# Patient Record
Sex: Male | Born: 1935
Health system: Southern US, Community
[De-identification: ages and names within clinical notes are randomized; demographics above are authoritative.]

## PROBLEM LIST (undated history)

## (undated) DIAGNOSIS — M810 Age-related osteoporosis without current pathological fracture: Secondary | ICD-10-CM

## (undated) DIAGNOSIS — E785 Hyperlipidemia, unspecified: Secondary | ICD-10-CM

## (undated) DIAGNOSIS — H409 Unspecified glaucoma: Secondary | ICD-10-CM

## (undated) DIAGNOSIS — E222 Syndrome of inappropriate secretion of antidiuretic hormone: Secondary | ICD-10-CM

## (undated) DIAGNOSIS — F0390 Unspecified dementia without behavioral disturbance: Secondary | ICD-10-CM

## (undated) DIAGNOSIS — I10 Essential (primary) hypertension: Secondary | ICD-10-CM

## (undated) HISTORY — PX: TRANSURETHRAL RESECTION OF PROSTATE: SHX73

## (undated) HISTORY — PX: CATARACT EXTRACTION, BILATERAL: SHX1313

---

## 2002-04-09 ENCOUNTER — Ambulatory Visit (HOSPITAL_COMMUNITY): Admission: RE | Admit: 2002-04-09 | Discharge: 2002-04-09 | Payer: Self-pay | Admitting: Gastroenterology

## 2004-03-05 ENCOUNTER — Inpatient Hospital Stay (HOSPITAL_COMMUNITY): Admission: AD | Admit: 2004-03-05 | Discharge: 2004-03-10 | Payer: Self-pay | Admitting: *Deleted

## 2011-01-26 ENCOUNTER — Other Ambulatory Visit: Payer: Self-pay | Admitting: Family Medicine

## 2011-01-26 DIAGNOSIS — R404 Transient alteration of awareness: Secondary | ICD-10-CM

## 2011-01-29 ENCOUNTER — Other Ambulatory Visit: Payer: Self-pay | Admitting: Family Medicine

## 2011-01-29 ENCOUNTER — Ambulatory Visit
Admission: RE | Admit: 2011-01-29 | Discharge: 2011-01-29 | Disposition: A | Discharge status: Home / Self Care | Payer: Medicare Other | Source: Ambulatory Visit | Attending: Family Medicine | Admitting: Family Medicine

## 2011-01-29 DIAGNOSIS — R404 Transient alteration of awareness: Secondary | ICD-10-CM

## 2011-01-29 MED ORDER — IOHEXOL 300 MG/ML  SOLN
75.0000 mL | Freq: Once | INTRAMUSCULAR | Status: AC | PRN
  Administered 2011-01-29: 75 mL via INTRAVENOUS

## 2013-01-30 ENCOUNTER — Other Ambulatory Visit: Payer: Self-pay | Admitting: Ophthalmology

## 2013-08-26 ENCOUNTER — Emergency Department (HOSPITAL_COMMUNITY): Payer: Medicare Other

## 2013-08-26 ENCOUNTER — Inpatient Hospital Stay (HOSPITAL_COMMUNITY): Payer: Medicare Other | Admitting: Anesthesiology

## 2013-08-26 ENCOUNTER — Encounter (HOSPITAL_COMMUNITY): Payer: Medicare Other | Admitting: Anesthesiology

## 2013-08-26 ENCOUNTER — Inpatient Hospital Stay (HOSPITAL_COMMUNITY)
Admission: EM | Admit: 2013-08-26 | Discharge: 2013-08-31 | DRG: 469 | Disposition: A | Discharge status: Skilled Nursing Facility | Payer: Medicare Other | Attending: Internal Medicine | Admitting: Internal Medicine

## 2013-08-26 ENCOUNTER — Encounter (HOSPITAL_COMMUNITY)
Admission: EM | Disposition: A | Discharge status: Skilled Nursing Facility | Payer: Self-pay | Source: Home / Self Care | Attending: Internal Medicine

## 2013-08-26 ENCOUNTER — Inpatient Hospital Stay (HOSPITAL_COMMUNITY): Payer: Medicare Other

## 2013-08-26 ENCOUNTER — Encounter (HOSPITAL_COMMUNITY): Payer: Self-pay | Admitting: Emergency Medicine

## 2013-08-26 DIAGNOSIS — Z9849 Cataract extraction status, unspecified eye: Secondary | ICD-10-CM

## 2013-08-26 DIAGNOSIS — Y92009 Unspecified place in unspecified non-institutional (private) residence as the place of occurrence of the external cause: Secondary | ICD-10-CM

## 2013-08-26 DIAGNOSIS — R1310 Dysphagia, unspecified: Secondary | ICD-10-CM

## 2013-08-26 DIAGNOSIS — M4040 Postural lordosis, site unspecified: Secondary | ICD-10-CM | POA: Diagnosis present

## 2013-08-26 DIAGNOSIS — E871 Hypo-osmolality and hyponatremia: Secondary | ICD-10-CM

## 2013-08-26 DIAGNOSIS — J690 Pneumonitis due to inhalation of food and vomit: Secondary | ICD-10-CM

## 2013-08-26 DIAGNOSIS — F0390 Unspecified dementia without behavioral disturbance: Secondary | ICD-10-CM | POA: Diagnosis present

## 2013-08-26 DIAGNOSIS — Z96649 Presence of unspecified artificial hip joint: Secondary | ICD-10-CM

## 2013-08-26 DIAGNOSIS — I10 Essential (primary) hypertension: Secondary | ICD-10-CM | POA: Diagnosis present

## 2013-08-26 DIAGNOSIS — H409 Unspecified glaucoma: Secondary | ICD-10-CM | POA: Diagnosis present

## 2013-08-26 DIAGNOSIS — IMO0002 Reserved for concepts with insufficient information to code with codable children: Secondary | ICD-10-CM

## 2013-08-26 DIAGNOSIS — S72012A Unspecified intracapsular fracture of left femur, initial encounter for closed fracture: Secondary | ICD-10-CM | POA: Diagnosis present

## 2013-08-26 DIAGNOSIS — E440 Moderate protein-calorie malnutrition: Secondary | ICD-10-CM

## 2013-08-26 DIAGNOSIS — D696 Thrombocytopenia, unspecified: Secondary | ICD-10-CM | POA: Diagnosis present

## 2013-08-26 DIAGNOSIS — S72002A Fracture of unspecified part of neck of left femur, initial encounter for closed fracture: Secondary | ICD-10-CM

## 2013-08-26 DIAGNOSIS — M810 Age-related osteoporosis without current pathological fracture: Secondary | ICD-10-CM | POA: Diagnosis present

## 2013-08-26 DIAGNOSIS — R9089 Other abnormal findings on diagnostic imaging of central nervous system: Secondary | ICD-10-CM

## 2013-08-26 DIAGNOSIS — Z7982 Long term (current) use of aspirin: Secondary | ICD-10-CM

## 2013-08-26 DIAGNOSIS — R930 Abnormal findings on diagnostic imaging of skull and head, not elsewhere classified: Secondary | ICD-10-CM

## 2013-08-26 DIAGNOSIS — R5082 Postprocedural fever: Secondary | ICD-10-CM | POA: Diagnosis not present

## 2013-08-26 DIAGNOSIS — E236 Other disorders of pituitary gland: Secondary | ICD-10-CM | POA: Diagnosis present

## 2013-08-26 DIAGNOSIS — R339 Retention of urine, unspecified: Secondary | ICD-10-CM | POA: Diagnosis not present

## 2013-08-26 DIAGNOSIS — S72033A Displaced midcervical fracture of unspecified femur, initial encounter for closed fracture: Principal | ICD-10-CM | POA: Diagnosis present

## 2013-08-26 DIAGNOSIS — W010XXA Fall on same level from slipping, tripping and stumbling without subsequent striking against object, initial encounter: Secondary | ICD-10-CM | POA: Diagnosis present

## 2013-08-26 DIAGNOSIS — E785 Hyperlipidemia, unspecified: Secondary | ICD-10-CM | POA: Diagnosis present

## 2013-08-26 DIAGNOSIS — E43 Unspecified severe protein-calorie malnutrition: Secondary | ICD-10-CM | POA: Diagnosis present

## 2013-08-26 DIAGNOSIS — R9389 Abnormal findings on diagnostic imaging of other specified body structures: Secondary | ICD-10-CM | POA: Diagnosis present

## 2013-08-26 DIAGNOSIS — S72019A Unspecified intracapsular fracture of unspecified femur, initial encounter for closed fracture: Secondary | ICD-10-CM

## 2013-08-26 DIAGNOSIS — Z8673 Personal history of transient ischemic attack (TIA), and cerebral infarction without residual deficits: Secondary | ICD-10-CM

## 2013-08-26 DIAGNOSIS — D62 Acute posthemorrhagic anemia: Secondary | ICD-10-CM | POA: Diagnosis not present

## 2013-08-26 DIAGNOSIS — R319 Hematuria, unspecified: Secondary | ICD-10-CM

## 2013-08-26 HISTORY — DX: Syndrome of inappropriate secretion of antidiuretic hormone: E22.2

## 2013-08-26 HISTORY — DX: Essential (primary) hypertension: I10

## 2013-08-26 HISTORY — PX: HIP ARTHROPLASTY: SHX981

## 2013-08-26 HISTORY — DX: Hyperlipidemia, unspecified: E78.5

## 2013-08-26 HISTORY — DX: Unspecified glaucoma: H40.9

## 2013-08-26 HISTORY — DX: Age-related osteoporosis without current pathological fracture: M81.0

## 2013-08-26 LAB — CBC WITH DIFFERENTIAL/PLATELET
BASOS ABS: 0 10*3/uL (ref 0.0–0.1)
BASOS PCT: 0 % (ref 0–1)
Eosinophils Absolute: 0 10*3/uL (ref 0.0–0.7)
Eosinophils Relative: 0 % (ref 0–5)
HEMATOCRIT: 40.1 % (ref 39.0–52.0)
Hemoglobin: 13.4 g/dL (ref 13.0–17.0)
LYMPHS PCT: 6 % — AB (ref 12–46)
Lymphs Abs: 0.4 10*3/uL — ABNORMAL LOW (ref 0.7–4.0)
MCH: 31.5 pg (ref 26.0–34.0)
MCHC: 33.4 g/dL (ref 30.0–36.0)
MCV: 94.1 fL (ref 78.0–100.0)
Monocytes Absolute: 0.6 10*3/uL (ref 0.1–1.0)
Monocytes Relative: 9 % (ref 3–12)
NEUTROS PCT: 85 % — AB (ref 43–77)
Neutro Abs: 5.9 10*3/uL (ref 1.7–7.7)
Platelets: 149 10*3/uL — ABNORMAL LOW (ref 150–400)
RBC: 4.26 MIL/uL (ref 4.22–5.81)
RDW: 13.3 % (ref 11.5–15.5)
WBC: 6.9 10*3/uL (ref 4.0–10.5)

## 2013-08-26 LAB — SURGICAL PCR SCREEN
MRSA, PCR: INVALID — AB
Staphylococcus aureus: INVALID — AB

## 2013-08-26 LAB — PROTIME-INR
INR: 1.01 (ref 0.00–1.49)
Prothrombin Time: 13.1 seconds (ref 11.6–15.2)

## 2013-08-26 LAB — BASIC METABOLIC PANEL
BUN: 21 mg/dL (ref 6–23)
CO2: 22 meq/L (ref 19–32)
CREATININE: 0.71 mg/dL (ref 0.50–1.35)
Calcium: 8.7 mg/dL (ref 8.4–10.5)
Chloride: 99 mEq/L (ref 96–112)
GFR, EST NON AFRICAN AMERICAN: 84 mL/min — AB (ref >90)
GLUCOSE: 127 mg/dL — AB (ref 70–99)
POTASSIUM: 4 meq/L (ref 3.7–5.3)
SODIUM: 136 meq/L — AB (ref 137–147)

## 2013-08-26 LAB — ABO/RH: ABO/RH(D): O POS

## 2013-08-26 LAB — TYPE AND SCREEN
ABO/RH(D): O POS
Antibody Screen: NEGATIVE

## 2013-08-26 SURGERY — HEMIARTHROPLASTY, HIP, DIRECT ANTERIOR APPROACH, FOR FRACTURE
Anesthesia: General

## 2013-08-26 SURGERY — HEMIARTHROPLASTY, HIP, DIRECT ANTERIOR APPROACH, FOR FRACTURE
Anesthesia: General | Site: Hip | Laterality: Left

## 2013-08-26 MED ORDER — CEFAZOLIN SODIUM-DEXTROSE 2-3 GM-% IV SOLR
2.0000 g | Freq: Four times a day (QID) | INTRAVENOUS | Status: AC
  Administered 2013-08-27 (×2): 2 g via INTRAVENOUS
  Filled 2013-08-26 (×2): qty 50

## 2013-08-26 MED ORDER — BUPIVACAINE-EPINEPHRINE PF 0.5-1:200000 % IJ SOLN
INTRAMUSCULAR | Status: DC | PRN
  Administered 2013-08-26: 28 mL

## 2013-08-26 MED ORDER — SUCCINYLCHOLINE CHLORIDE 20 MG/ML IJ SOLN
INTRAMUSCULAR | Status: AC
  Filled 2013-08-26: qty 1

## 2013-08-26 MED ORDER — ACETAMINOPHEN 10 MG/ML IV SOLN
INTRAVENOUS | Status: DC | PRN
  Administered 2013-08-26: 1000 mg via INTRAVENOUS

## 2013-08-26 MED ORDER — FERROUS SULFATE 325 (65 FE) MG PO TABS
325.0000 mg | ORAL_TABLET | Freq: Every day | ORAL | Status: DC
  Administered 2013-08-27 – 2013-08-31 (×5): 325 mg via ORAL
  Filled 2013-08-26 (×6): qty 1

## 2013-08-26 MED ORDER — NEOSTIGMINE METHYLSULFATE 1 MG/ML IJ SOLN
INTRAMUSCULAR | Status: DC | PRN
  Administered 2013-08-26: 3 mg via INTRAVENOUS

## 2013-08-26 MED ORDER — PROPOFOL 10 MG/ML IV BOLUS
INTRAVENOUS | Status: DC | PRN
  Administered 2013-08-26: 150 mg via INTRAVENOUS

## 2013-08-26 MED ORDER — SODIUM CHLORIDE 0.9 % IV SOLN
INTRAVENOUS | Status: DC
  Administered 2013-08-26 – 2013-08-29 (×4): via INTRAVENOUS

## 2013-08-26 MED ORDER — SODIUM CHLORIDE 0.9 % IV SOLN: INTRAVENOUS | Status: DC

## 2013-08-26 MED ORDER — ROCURONIUM BROMIDE 100 MG/10ML IV SOLN
INTRAVENOUS | Status: DC | PRN
  Administered 2013-08-26: 40 mg via INTRAVENOUS

## 2013-08-26 MED ORDER — LIDOCAINE HCL (CARDIAC) 20 MG/ML IV SOLN
INTRAVENOUS | Status: DC | PRN
  Administered 2013-08-26: 75 mg via INTRAVENOUS
  Administered 2013-08-26: 25 mg via INTRAVENOUS

## 2013-08-26 MED ORDER — ONDANSETRON HCL 4 MG/2ML IJ SOLN
INTRAMUSCULAR | Status: DC | PRN
  Administered 2013-08-26: 4 mg via INTRAVENOUS

## 2013-08-26 MED ORDER — NEOSTIGMINE METHYLSULFATE 1 MG/ML IJ SOLN
INTRAMUSCULAR | Status: AC
  Filled 2013-08-26: qty 10

## 2013-08-26 MED ORDER — MORPHINE SULFATE 2 MG/ML IJ SOLN
0.5000 mg | INTRAMUSCULAR | Status: DC | PRN
  Administered 2013-08-26 (×2): 0.5 mg via INTRAVENOUS
  Filled 2013-08-26 (×2): qty 1

## 2013-08-26 MED ORDER — BUPIVACAINE-EPINEPHRINE 0.5% -1:200000 IJ SOLN
INTRAMUSCULAR | Status: AC
  Filled 2013-08-26: qty 1

## 2013-08-26 MED ORDER — LIDOCAINE HCL (CARDIAC) 20 MG/ML IV SOLN
INTRAVENOUS | Status: AC
  Filled 2013-08-26: qty 5

## 2013-08-26 MED ORDER — ACETAMINOPHEN 325 MG PO TABS
650.0000 mg | ORAL_TABLET | Freq: Four times a day (QID) | ORAL | Status: DC | PRN
  Administered 2013-08-27 – 2013-08-31 (×4): 650 mg via ORAL
  Filled 2013-08-26 (×4): qty 2

## 2013-08-26 MED ORDER — HYDROCODONE-ACETAMINOPHEN 5-325 MG PO TABS
1.0000 | ORAL_TABLET | Freq: Four times a day (QID) | ORAL | Status: DC | PRN
  Administered 2013-08-27 – 2013-08-31 (×7): 1 via ORAL
  Filled 2013-08-26 (×7): qty 1

## 2013-08-26 MED ORDER — TRANEXAMIC ACID 100 MG/ML IV SOLN
1000.0000 mg | INTRAVENOUS | Status: AC
  Administered 2013-08-26: 1000 mg via INTRAVENOUS
  Filled 2013-08-26: qty 10

## 2013-08-26 MED ORDER — HYDROCODONE-ACETAMINOPHEN 5-325 MG PO TABS: 1.0000 | ORAL_TABLET | Freq: Four times a day (QID) | ORAL | Status: DC | PRN

## 2013-08-26 MED ORDER — PHENYLEPHRINE HCL 10 MG/ML IJ SOLN
INTRAMUSCULAR | Status: AC
  Filled 2013-08-26: qty 1

## 2013-08-26 MED ORDER — ASPIRIN EC 325 MG PO TBEC: 325.0000 mg | DELAYED_RELEASE_TABLET | Freq: Two times a day (BID) | ORAL | Status: AC

## 2013-08-26 MED ORDER — SODIUM CHLORIDE 0.9 % IV SOLN
1000.0000 mL | INTRAVENOUS | Status: DC
  Administered 2013-08-26: 1000 mL via INTRAVENOUS

## 2013-08-26 MED ORDER — FENTANYL CITRATE 0.05 MG/ML IJ SOLN: 25.0000 ug | INTRAMUSCULAR | Status: DC | PRN

## 2013-08-26 MED ORDER — BUPIVACAINE-EPINEPHRINE PF 0.5-1:200000 % IJ SOLN
INTRAMUSCULAR | Status: AC
  Filled 2013-08-26: qty 30

## 2013-08-26 MED ORDER — ACETAMINOPHEN 10 MG/ML IV SOLN: 1000.0000 mg | Freq: Once | INTRAVENOUS | Status: DC

## 2013-08-26 MED ORDER — FENTANYL CITRATE 0.05 MG/ML IJ SOLN
INTRAMUSCULAR | Status: DC | PRN
  Administered 2013-08-26: 100 ug via INTRAVENOUS
  Administered 2013-08-26: 50 ug via INTRAVENOUS
  Administered 2013-08-26: 100 ug via INTRAVENOUS

## 2013-08-26 MED ORDER — TIMOLOL HEMIHYDRATE 0.5 % OP SOLN: 1.0000 [drp] | Freq: Every day | OPHTHALMIC | Status: DC

## 2013-08-26 MED ORDER — CEFAZOLIN SODIUM-DEXTROSE 2-3 GM-% IV SOLR
INTRAVENOUS | Status: AC
  Filled 2013-08-26: qty 50

## 2013-08-26 MED ORDER — RAMIPRIL 10 MG PO CAPS
10.0000 mg | ORAL_CAPSULE | Freq: Every day | ORAL | Status: DC
  Administered 2013-08-27 – 2013-08-31 (×5): 10 mg via ORAL
  Filled 2013-08-26 (×5): qty 1

## 2013-08-26 MED ORDER — PILOCARPINE HCL 4 % OP SOLN
1.0000 [drp] | Freq: Four times a day (QID) | OPHTHALMIC | Status: DC
Start: 2013-08-26 — End: 2013-08-31
  Administered 2013-08-27 – 2013-08-31 (×18): 1 [drp] via OPHTHALMIC
  Filled 2013-08-26: qty 15

## 2013-08-26 MED ORDER — DOCUSATE SODIUM 100 MG PO CAPS
100.0000 mg | ORAL_CAPSULE | Freq: Two times a day (BID) | ORAL | Status: DC
  Administered 2013-08-27 – 2013-08-31 (×9): 100 mg via ORAL

## 2013-08-26 MED ORDER — PHENYLEPHRINE HCL 10 MG/ML IJ SOLN
INTRAMUSCULAR | Status: DC | PRN
Start: 2013-08-26 — End: 2013-08-26
  Administered 2013-08-26: 80 ug via INTRAVENOUS
  Administered 2013-08-26: 120 ug via INTRAVENOUS
  Administered 2013-08-26: 160 ug via INTRAVENOUS
  Administered 2013-08-26: 80 ug via INTRAVENOUS

## 2013-08-26 MED ORDER — ONDANSETRON HCL 4 MG PO TABS: 4.0000 mg | ORAL_TABLET | Freq: Four times a day (QID) | ORAL | Status: DC | PRN

## 2013-08-26 MED ORDER — FENTANYL CITRATE 0.05 MG/ML IJ SOLN
50.0000 ug | Freq: Once | INTRAMUSCULAR | Status: AC
  Administered 2013-08-26: 50 ug via INTRAVENOUS
  Filled 2013-08-26: qty 2

## 2013-08-26 MED ORDER — ASPIRIN EC 325 MG PO TBEC
325.0000 mg | DELAYED_RELEASE_TABLET | Freq: Two times a day (BID) | ORAL | Status: DC
  Administered 2013-08-27 – 2013-08-31 (×9): 325 mg via ORAL
  Filled 2013-08-26 (×11): qty 1

## 2013-08-26 MED ORDER — GLYCOPYRROLATE 0.2 MG/ML IJ SOLN
INTRAMUSCULAR | Status: DC | PRN
  Administered 2013-08-26: 0.4 mg via INTRAVENOUS

## 2013-08-26 MED ORDER — ALUM & MAG HYDROXIDE-SIMETH 200-200-20 MG/5ML PO SUSP: 30.0000 mL | ORAL | Status: DC | PRN

## 2013-08-26 MED ORDER — SUCCINYLCHOLINE CHLORIDE 20 MG/ML IJ SOLN
INTRAMUSCULAR | Status: DC | PRN
  Administered 2013-08-26: 100 mg via INTRAVENOUS

## 2013-08-26 MED ORDER — ONDANSETRON HCL 4 MG/2ML IJ SOLN: 4.0000 mg | Freq: Four times a day (QID) | INTRAMUSCULAR | Status: DC | PRN

## 2013-08-26 MED ORDER — ROCURONIUM BROMIDE 100 MG/10ML IV SOLN
INTRAVENOUS | Status: AC
  Filled 2013-08-26: qty 1

## 2013-08-26 MED ORDER — TIMOLOL MALEATE 0.5 % OP SOLN
1.0000 [drp] | Freq: Every day | OPHTHALMIC | Status: DC
  Administered 2013-08-27 – 2013-08-31 (×5): 1 [drp] via OPHTHALMIC
  Filled 2013-08-26: qty 5

## 2013-08-26 MED ORDER — FENTANYL CITRATE 0.05 MG/ML IJ SOLN
INTRAMUSCULAR | Status: AC
  Filled 2013-08-26: qty 5

## 2013-08-26 MED ORDER — ASPIRIN 81 MG PO CHEW: 81.0000 mg | CHEWABLE_TABLET | Freq: Every day | ORAL | Status: DC

## 2013-08-26 MED ORDER — ACETAMINOPHEN 650 MG RE SUPP: 650.0000 mg | Freq: Four times a day (QID) | RECTAL | Status: DC | PRN

## 2013-08-26 MED ORDER — ONDANSETRON HCL 4 MG/2ML IJ SOLN
INTRAMUSCULAR | Status: AC
  Filled 2013-08-26: qty 2

## 2013-08-26 MED ORDER — DEXTROSE 5 % IV SOLN
10.0000 mg | INTRAVENOUS | Status: DC | PRN
  Administered 2013-08-26: 10 ug/min via INTRAVENOUS

## 2013-08-26 MED ORDER — CHLORHEXIDINE GLUCONATE 4 % EX LIQD
60.0000 mL | Freq: Once | CUTANEOUS | Status: DC
  Filled 2013-08-26: qty 60

## 2013-08-26 MED ORDER — HEPARIN SODIUM (PORCINE) 5000 UNIT/ML IJ SOLN
5000.0000 [IU] | Freq: Three times a day (TID) | INTRAMUSCULAR | Status: DC
  Filled 2013-08-26 (×2): qty 1

## 2013-08-26 MED ORDER — FENTANYL CITRATE 0.05 MG/ML IJ SOLN
100.0000 ug | Freq: Once | INTRAMUSCULAR | Status: AC
  Administered 2013-08-26: 100 ug via INTRAVENOUS
  Filled 2013-08-26: qty 2

## 2013-08-26 MED ORDER — HYDROCODONE-ACETAMINOPHEN 5-325 MG PO TABS: 1.0000 | ORAL_TABLET | Freq: Three times a day (TID) | ORAL | Status: AC | PRN

## 2013-08-26 MED ORDER — METHOCARBAMOL 100 MG/ML IJ SOLN
500.0000 mg | Freq: Four times a day (QID) | INTRAVENOUS | Status: DC | PRN
  Filled 2013-08-26: qty 5

## 2013-08-26 MED ORDER — CEFAZOLIN SODIUM-DEXTROSE 2-3 GM-% IV SOLR
INTRAVENOUS | Status: DC | PRN
  Administered 2013-08-26: 2 g via INTRAVENOUS

## 2013-08-26 MED ORDER — ACETAMINOPHEN 10 MG/ML IV SOLN
1000.0000 mg | Freq: Once | INTRAVENOUS | Status: DC
  Filled 2013-08-26: qty 100

## 2013-08-26 MED ORDER — PROPOFOL 10 MG/ML IV BOLUS
INTRAVENOUS | Status: AC
  Filled 2013-08-26: qty 20

## 2013-08-26 MED ORDER — LACTATED RINGERS IV SOLN
INTRAVENOUS | Status: DC | PRN
  Administered 2013-08-26 (×2): via INTRAVENOUS

## 2013-08-26 MED ORDER — METHOCARBAMOL 500 MG PO TABS
500.0000 mg | ORAL_TABLET | Freq: Four times a day (QID) | ORAL | Status: DC | PRN
  Administered 2013-08-29: 500 mg via ORAL
  Filled 2013-08-26: qty 1

## 2013-08-26 MED ORDER — PHENYLEPHRINE 40 MCG/ML (10ML) SYRINGE FOR IV PUSH (FOR BLOOD PRESSURE SUPPORT)
PREFILLED_SYRINGE | INTRAVENOUS | Status: AC
  Filled 2013-08-26: qty 10

## 2013-08-26 MED ORDER — GLYCOPYRROLATE 0.2 MG/ML IJ SOLN
INTRAMUSCULAR | Status: AC
  Filled 2013-08-26: qty 3

## 2013-08-26 MED ORDER — SENNOSIDES-DOCUSATE SODIUM 8.6-50 MG PO TABS: 1.0000 | ORAL_TABLET | Freq: Every evening | ORAL | Status: DC | PRN

## 2013-08-26 MED ORDER — TRANEXAMIC ACID 100 MG/ML IV SOLN
1000.0000 mg | INTRAVENOUS | Status: DC
  Filled 2013-08-26: qty 10

## 2013-08-26 MED ORDER — DEXAMETHASONE SODIUM PHOSPHATE 10 MG/ML IJ SOLN
INTRAMUSCULAR | Status: DC | PRN
Start: 2013-08-26 — End: 2013-08-26
  Administered 2013-08-26: 10 mg via INTRAVENOUS

## 2013-08-26 SURGICAL SUPPLY — 46 items
BAG SPEC THK2 15X12 ZIP CLS (MISCELLANEOUS)
BAG ZIPLOCK 12X15 (MISCELLANEOUS) ×1 IMPLANT
BIT DRILL 2.4X128 (BIT) ×3 IMPLANT
BLADE SAW SAG 73X25 THK (BLADE) ×1
BLADE SAW SGTL 73X25 THK (BLADE) ×1 IMPLANT
BRUSH FEMORAL CANAL (MISCELLANEOUS) IMPLANT
CAPT HIP FX BIPOLAR/UNIPOLAR ×1 IMPLANT
CEMENT BONE DEPUY (Cement) ×2 IMPLANT
CEMENT RESTRICTOR DEPUY SZ 3 (Cement) ×1 IMPLANT
COVER SURGICAL LIGHT HANDLE (MISCELLANEOUS) ×2 IMPLANT
DRAPE INCISE IOBAN 66X45 STRL (DRAPES) ×2 IMPLANT
DRAPE ORTHO SPLIT 77X108 STRL (DRAPES) ×4
DRAPE POUCH INSTRU U-SHP 10X18 (DRAPES) ×2 IMPLANT
DRAPE SURG ORHT 6 SPLT 77X108 (DRAPES) ×2 IMPLANT
DRSG MEPILEX BORDER 4X8 (GAUZE/BANDAGES/DRESSINGS) ×2 IMPLANT
DURAPREP 26ML APPLICATOR (WOUND CARE) ×2 IMPLANT
ELECT REM PT RETURN 9FT ADLT (ELECTROSURGICAL) ×2
ELECTRODE REM PT RTRN 9FT ADLT (ELECTROSURGICAL) ×1 IMPLANT
EVACUATOR 1/8 PVC DRAIN (DRAIN) ×1 IMPLANT
FACESHIELD LNG OPTICON STERILE (SAFETY) ×6 IMPLANT
GLOVE BIO SURGEON STRL SZ8 (GLOVE) ×4 IMPLANT
GLOVE ECLIPSE 7.5 STRL STRAW (GLOVE) ×8 IMPLANT
HANDPIECE INTERPULSE COAX TIP (DISPOSABLE)
HOOD PEEL AWAY FACE SHEILD DIS (HOOD) ×4 IMPLANT
IMMOBILIZER KNEE 20 (SOFTGOODS) ×1 IMPLANT
MANIFOLD NEPTUNE II (INSTRUMENTS) ×2 IMPLANT
NEEDLE HYPO 22GX1.5 SAFETY (NEEDLE) ×2 IMPLANT
NS IRRIG 1000ML POUR BTL (IV SOLUTION) ×2 IMPLANT
PACK TOTAL JOINT (CUSTOM PROCEDURE TRAY) ×2 IMPLANT
PASSER SUT SWANSON 36MM LOOP (INSTRUMENTS) ×2 IMPLANT
POSITIONER SURGICAL ARM (MISCELLANEOUS) ×2 IMPLANT
PRESSURIZER FEMORAL UNIV (MISCELLANEOUS) ×1 IMPLANT
SET HNDPC FAN SPRY TIP SCT (DISPOSABLE) IMPLANT
STAPLER VISISTAT 35W (STAPLE) ×2 IMPLANT
SUCTION FRAZIER TIP 10 FR DISP (SUCTIONS) ×2 IMPLANT
SUT ETHIBOND NAB CT1 #1 30IN (SUTURE) ×4 IMPLANT
SUT VIC AB 0 CTX 27 (SUTURE) ×4 IMPLANT
SUT VIC AB 1 CTX 36 (SUTURE) ×6
SUT VIC AB 1 CTX36XBRD ANBCTR (SUTURE) ×4 IMPLANT
SUT VIC AB 2-0 CT1 27 (SUTURE) ×4
SUT VIC AB 2-0 CT1 TAPERPNT 27 (SUTURE) ×3 IMPLANT
SYR CONTROL 10ML LL (SYRINGE) ×2 IMPLANT
TOWEL OR 17X26 10 PK STRL BLUE (TOWEL DISPOSABLE) ×2 IMPLANT
TOWER CARTRIDGE SMART MIX (DISPOSABLE) ×1 IMPLANT
TRAY FOLEY CATH 14FRSI W/METER (CATHETERS) IMPLANT
WATER STERILE IRR 1500ML POUR (IV SOLUTION) ×1 IMPLANT

## 2013-08-26 NOTE — Consult Note (Signed)
Reason for Consult:femoral neck fracture left side. Referring Physician: hospitalist service  Todd Whitaker is an 80 y.o. male.  HPI: the patient is an 80 year old otherwise healthy male who fell onto his left side today.  He was admitted to the hospital with a diagnosis of left femoral neck fracture.  We were consulted for management of the left femoral neck fracture.  He was admitted to the internal medicine service and will be followed by them for medical issues and we will treat his left femoral neck fracture.  Past Medical History  Diagnosis Date  . Hypertension   . Glaucoma   . Osteoporosis   . Hyperlipidemia     Past Surgical History  Procedure Laterality Date  . Cataract extraction, bilateral    . Transurethral resection of prostate      History reviewed. No pertinent family history.  Social History:  reports that he has never smoked. He has never used smokeless tobacco. He reports that he does not drink alcohol or use illicit drugs.  Allergies: No Known Allergies  Medications: I have reviewed the patient's current medications.  Results for orders placed during the hospital encounter of 08/26/13 (from the past 48 hour(s))  BASIC METABOLIC PANEL     Status: Abnormal   Collection Time    08/26/13 11:00 AM      Result Value Ref Range   Sodium 136 (*) 137 - 147 mEq/L   Potassium 4.0  3.7 - 5.3 mEq/L   Chloride 99  96 - 112 mEq/L   CO2 22  19 - 32 mEq/L   Glucose, Bld 127 (*) 70 - 99 mg/dL   BUN 21  6 - 23 mg/dL   Creatinine, Ser 0.71  0.50 - 1.35 mg/dL   Calcium 8.7  8.4 - 10.5 mg/dL   GFR calc non Af Amer 84 (*) >90 mL/min   GFR calc Af Amer >90  >90 mL/min   Comment: (NOTE)     The eGFR has been calculated using the CKD EPI equation.     This calculation has not been validated in all clinical situations.     eGFR's persistently <90 mL/min signify possible Chronic Kidney     Disease.  CBC WITH DIFFERENTIAL     Status: Abnormal   Collection Time    08/26/13  11:00 AM      Result Value Ref Range   WBC 6.9  4.0 - 10.5 K/uL   RBC 4.26  4.22 - 5.81 MIL/uL   Hemoglobin 13.4  13.0 - 17.0 g/dL   HCT 40.1  39.0 - 52.0 %   MCV 94.1  78.0 - 100.0 fL   MCH 31.5  26.0 - 34.0 pg   MCHC 33.4  30.0 - 36.0 g/dL   RDW 13.3  11.5 - 15.5 %   Platelets 149 (*) 150 - 400 K/uL   Neutrophils Relative % 85 (*) 43 - 77 %   Neutro Abs 5.9  1.7 - 7.7 K/uL   Lymphocytes Relative 6 (*) 12 - 46 %   Lymphs Abs 0.4 (*) 0.7 - 4.0 K/uL   Monocytes Relative 9  3 - 12 %   Monocytes Absolute 0.6  0.1 - 1.0 K/uL   Eosinophils Relative 0  0 - 5 %   Eosinophils Absolute 0.0  0.0 - 0.7 K/uL   Basophils Relative 0  0 - 1 %   Basophils Absolute 0.0  0.0 - 0.1 K/uL  PROTIME-INR     Status: None  Collection Time    08/26/13 11:00 AM      Result Value Ref Range   Prothrombin Time 13.1  11.6 - 15.2 seconds   INR 1.01  0.00 - 1.49  TYPE AND SCREEN     Status: None   Collection Time    08/26/13 11:00 AM      Result Value Ref Range   ABO/RH(D) O POS     Antibody Screen NEG     Sample Expiration 08/29/2013    ABO/RH     Status: None   Collection Time    08/26/13 11:00 AM      Result Value Ref Range   ABO/RH(D) O POS      Dg Hip Complete Left  08/26/2013   CLINICAL DATA:  Fall.  EXAM: LEFT HIP - COMPLETE 2+ VIEW  COMPARISON:  None.  FINDINGS: Angulated subcapital femoral neck fracture is present on the left. Diffuse osteopenia and degenerative change present. Calcifications in pelvis consistent with phleboliths.  IMPRESSION: Angulated left subcapital femoral neck fracture.   Electronically Signed   By: Marcello Moores  Register   On: 08/26/2013 10:22   Ct Head Wo Contrast  08/26/2013   CLINICAL DATA:  Mental status change, status post fall.  EXAM: CT HEAD WITHOUT CONTRAST  TECHNIQUE: Contiguous axial images were obtained from the base of the skull through the vertex without intravenous contrast.  COMPARISON:  CT HEAD WO/W CM dated 01/29/2011  FINDINGS: There is mild diffuse cerebral  and cerebellar atrophy with compensatory ventriculomegaly. There is no evidence of an acute intracranial hemorrhage. There is hypodensity in the left cerebellar hemisphere that is asymmetric with that on the right. This is demonstrated on images 6-8. This may reflect ischemic change. The brainstem exhibits no acute abnormality. There is decreased density in the deep white matter of both cerebral hemispheres consistent with chronic small vessel ischemic type change.  At bone window settings the observed portions of the paranasal sinuses and mastoid air cells are clear. There is no evidence of an acute skull fracture.  IMPRESSION: 1. There is no evidence of an acute intracranial hemorrhage. 2. There is subtle hypodensity in the left cerebellar hemisphere asymmetric with the same region on the right. This may reflect evolving ischemic change but it may be technical in nature. 3. There is no evidence of an acute ischemic event involving the cerebrum. There is decreased density bilaterally in the deep white matter of the cerebrum consistent with chronic small vessel ischemic type change. Follow-up CT scanning or MRI is recommended if the patient's symptoms persist.   Electronically Signed   By: David  [place]   On: 08/26/2013 11:28   Dg Chest Port 1 View  08/26/2013   CLINICAL DATA:  Left hip fracture  EXAM: PORTABLE CHEST - 1 VIEW  COMPARISON:  None.  FINDINGS: Study is limited by poor inspiration. Mild elevation of the right hemidiaphragm with right basilar atelectasis. No acute infiltrate or pulmonary edema. Atherosclerotic calcifications of thoracic spine.  IMPRESSION: No infiltrate or pulmonary edema. Mild elevation of the right hemidiaphragm with right basilar atelectasis   Electronically Signed   By: Lahoma Crocker M.D.   On: 08/26/2013 10:58    ROS ROS: I have reviewed the patient's review of systems thoroughly and there are no positive responses as relates to the HPI. Blood pressure 164/68, pulse 97,  temperature 99.3 F (37.4 C), temperature source Oral, resp. rate 18, height 5' 10"  (1.778 m), weight 138 lb (62.596 kg), SpO2 98.00%. Physical Exam  Well-developed well-nourished patient in no acute distress. Alert and oriented x3 HEENT:within normal limits Cardiac: Regular rate and rhythm Pulmonary: Lungs clear to auscultation Abdomen: Soft and nontender.  Normal active bowel sounds  Musculoskeletal:left leg externally rotated and shortened.  Pain with all range of motion.  2+ distal pulses. Assessment/Plan: The patient is an 80 year old male with a displaced left femoral neck fracture.  We had a long discussion with he and his family about treatment options but feel that hemiarthroplasty left side is the most appropriate course of action.  We will plan on this later today to allow him the easiest transition to the rehabilitative period.I have had a prolonged discussion with the patient regarding the risk and benefits of the surgical procedure.  The patient understands the risks include but are not limited to bleeding infection and failure of the surgery to cure the problem and need for further surgery.  The patient understands there is a slight risk of death at the time of surgery.  The patient understands these risks along with the potential benefits and wishes to proceed with surgical intervention.  .  The patient will be followed in the office in the postoperative period.  Thomes Burak L 08/26/2013, 4:54 PM

## 2013-08-26 NOTE — ED Notes (Signed)
Per EMS: Pt from [facility].  Pt fell in bathroom (unwitnessed). Unsure what time or how long he has been on the floor.  At his baseline mental status per staff.  But is confused to time.  Lt hip pain and thigh pain.  No pain to palpation, no crepitus, pain on movement.  Could not straighten leg d/t pain so unsure about shortening.

## 2013-08-26 NOTE — Preoperative (Signed)
Beta Blockers   Reason not to administer Beta Blockers:Not Applicable 

## 2013-08-26 NOTE — ED Provider Notes (Signed)
CSN: [id number]     Arrival date & time 08/26/13  0932 History   First MD Initiated Contact with Patient 08/26/13 0935     Chief Complaint  Patient presents with  . Fall  . Hip Pain     (Consider location/radiation/quality/duration/timing/severity/associated sxs/prior Treatment) Patient is a 80 y.o. male presenting with fall and hip pain. The history is provided by the patient.  Fall This is a new problem. Pertinent negatives include no chest pain, no abdominal pain, no headaches and no shortness of breath.  Hip Pain Pertinent negatives include no chest pain, no abdominal pain, no headaches and no shortness of breath.   patient states that he slipped and fell on his left hip. He states he has pain in the hip/pelvis area. He states he's been able to ambulate since the event. He states he slipped this morning. He denies numbness or weakness. He has some reported baseline dementia, but appears to give a good history. He states he did not hit his head.  Past Medical History  Diagnosis Date  . Hypertension   . Glaucoma   . Osteoporosis   . Hyperlipidemia    History reviewed. No pertinent past surgical history. History reviewed. No pertinent family history. History  Substance Use Topics  . Smoking status: Never Smoker   . Smokeless tobacco: Not on file  . Alcohol Use: No    Review of Systems  Constitutional: Negative for fever and appetite change.  Respiratory: Negative for cough and shortness of breath.   Cardiovascular: Negative for chest pain.  Gastrointestinal: Negative for abdominal pain.  Genitourinary: Negative for dysuria and flank pain.  Musculoskeletal: Negative for back pain, gait problem and neck pain.  Skin: Negative for wound.  Neurological: Negative for headaches.  Psychiatric/Behavioral: Negative for confusion.      Allergies  Review of patient's allergies indicates no known allergies.  Home Medications   Current Outpatient Rx  Name  Route  Sig   Dispense  Refill  . acetaminophen (TYLENOL) 500 MG tablet   Oral   Take 500-1,000 mg by mouth every 6 (six) hours as needed for mild pain.         Marland Kitchen. aspirin 81 MG tablet   Oral   Take 81 mg by mouth daily.         . Cholecalciferol (VITAMIN D) 2000 UNITS tablet   Oral   Take 2,000 Units by mouth daily.         . Multiple Vitamins-Minerals (ICAPS) TABS   Oral   Take 1 tablet by mouth daily.         . pilocarpine (PILOCAR) 4 % ophthalmic solution   Both Eyes   Place 1 drop into both eyes 4 (four) times daily.         . ramipril (ALTACE) 10 MG capsule   Oral   Take 10 mg by mouth daily.         . timolol (BETIMOL) 0.5 % ophthalmic solution   Both Eyes   Place 1 drop into both eyes daily.          BP 122/59  Pulse 90  Temp(Src) 98.4 F (36.9 C) (Oral)  Resp 18  SpO2 96% Physical Exam  Constitutional: He appears well-developed and well-nourished.  HENT:  Head: Normocephalic.  Cardiovascular: Normal rate and regular rhythm.   Pulmonary/Chest: Effort normal and breath sounds normal.  Abdominal: Soft. Bowel sounds are normal. There is no tenderness.  Musculoskeletal:  Range of motion intact grossly  and left hip. Some pain near top of range of motion. Mild tenderness over left pelvis. No ecchymosis. No S3 intact over bilateral lower extremities. No tenderness of her upper shoulders. No cervical spine tenderness. No lumbar tenderness. No evidence of trauma on the head.  Skin: Skin is warm.    ED Course  Procedures (including critical care time) Labs Review Labs Reviewed  BASIC METABOLIC PANEL - Abnormal; Notable for the following:    Sodium 136 (*)    Glucose, Bld 127 (*)    GFR calc non Af Amer 84 (*)    All other components within normal limits  CBC WITH DIFFERENTIAL - Abnormal; Notable for the following:    Platelets 149 (*)    Neutrophils Relative % 85 (*)    Lymphocytes Relative 6 (*)    Lymphs Abs 0.4 (*)    All other components within normal  limits  PROTIME-INR  TYPE AND SCREEN  ABO/RH   Imaging Review Dg Hip Complete Left  08/26/2013   CLINICAL DATA:  Fall.  EXAM: LEFT HIP - COMPLETE 2+ VIEW  COMPARISON:  None.  FINDINGS: Angulated subcapital femoral neck fracture is present on the left. Diffuse osteopenia and degenerative change present. Calcifications in pelvis consistent with phleboliths.  IMPRESSION: Angulated left subcapital femoral neck fracture.   Electronically Signed   By: Maisie Fus  Register   On: 08/26/2013 10:22   Ct Head Wo Contrast  08/26/2013   CLINICAL DATA:  Mental status change, status post fall.  EXAM: CT HEAD WITHOUT CONTRAST  TECHNIQUE: Contiguous axial images were obtained from the base of the skull through the vertex without intravenous contrast.  COMPARISON:  CT HEAD WO/W CM dated 01/29/2011  FINDINGS: There is mild diffuse cerebral and cerebellar atrophy with compensatory ventriculomegaly. There is no evidence of an acute intracranial hemorrhage. There is hypodensity in the left cerebellar hemisphere that is asymmetric with that on the right. This is demonstrated on images 6-8. This may reflect ischemic change. The brainstem exhibits no acute abnormality. There is decreased density in the deep white matter of both cerebral hemispheres consistent with chronic small vessel ischemic type change.  At bone window settings the observed portions of the paranasal sinuses and mastoid air cells are clear. There is no evidence of an acute skull fracture.  IMPRESSION: 1. There is no evidence of an acute intracranial hemorrhage. 2. There is subtle hypodensity in the left cerebellar hemisphere asymmetric with the same region on the right. This may reflect evolving ischemic change but it may be technical in nature. 3. There is no evidence of an acute ischemic event involving the cerebrum. There is decreased density bilaterally in the deep white matter of the cerebrum consistent with chronic small vessel ischemic type change. Follow-up  CT scanning or MRI is recommended if the patient's symptoms persist.   Electronically Signed   By: David  [place]   On: 08/26/2013 11:28   Dg Chest Port 1 View  08/26/2013   CLINICAL DATA:  Left hip fracture  EXAM: PORTABLE CHEST - 1 VIEW  COMPARISON:  None.  FINDINGS: Study is limited by poor inspiration. Mild elevation of the right hemidiaphragm with right basilar atelectasis. No acute infiltrate or pulmonary edema. Atherosclerotic calcifications of thoracic spine.  IMPRESSION: No infiltrate or pulmonary edema. Mild elevation of the right hemidiaphragm with right basilar atelectasis   Electronically Signed   By: Natasha Mead M.D.   On: 08/26/2013 10:58    EKG Interpretation   None  MDM   Final diagnoses:  Subcapital fracture of femur    The patient with fall and left subcapital hip fracture. Reportedly mechanical fall. Limit to internal medicine and Dr. Luiz Blare will take the patient to the operating room   [facility] R. Rubin Payor, MD 08/26/13 1218

## 2013-08-26 NOTE — Anesthesia Postprocedure Evaluation (Addendum)
  Anesthesia Post-op Note  Patient: Todd Whitaker  Procedure(s) Performed: Procedure(s) (LRB): ARTHROPLASTY BIPOLAR HIP (Left)  Patient Location: PACU  Anesthesia Type: General  Level of Consciousness: awake and alert   Airway and Oxygen Therapy: Patient Spontanous Breathing  Post-op Pain: mild  Post-op Assessment: Post-op Vital signs reviewed, Patient's Cardiovascular Status Stable, Respiratory Function Stable, Patent Airway and No signs of Nausea or vomiting  Last Vitals:  Filed Vitals:   08/26/13 2145  BP: 167/74  Pulse: 107  Temp:   Resp: 23    Post-op Vital Signs: stable   Complications: No apparent anesthesia complications. Febrile and tachycardia. Urine will be sent for culture per orthopedics.

## 2013-08-26 NOTE — ED Notes (Signed)
Pt attempting to urinate without success.  Family at bedside.

## 2013-08-26 NOTE — Brief Op Note (Signed)
08/26/2013  8:09 PM  PATIENT:  Lynnda ChildSanford D Leung  80 y.o. male  PRE-OPERATIVE DIAGNOSIS:  fx left hip  POST-OPERATIVE DIAGNOSIS:  fx left hip  PROCEDURE:  Procedure(s): ARTHROPLASTY BIPOLAR HIP (Left)  SURGEON:  Surgeon(s) and Role:    * Harvie JuniorJohn L Keyarah Mcroy, MD - Primary  PHYSICIAN ASSISTANT:   ASSISTANTS: bethune   ANESTHESIA:   general  EBL:  Total I/O In: 1000 [I.V.:1000] Out: -   BLOOD ADMINISTERED:none  DRAINS: none   LOCAL MEDICATIONS USED:  MARCAINE     SPECIMEN:  No Specimen  DISPOSITION OF SPECIMEN:  N/A  COUNTS:  YES  TOURNIQUET:  * No tourniquets in log *  DICTATION: .Other Dictation: Dictation Number [id number]  PLAN OF CARE: Admit to inpatient   PATIENT DISPOSITION:  PACU - hemodynamically stable.   Delay start of Pharmacological VTE agent (>24hrs) due to surgical blood loss or risk of bleeding: no

## 2013-08-26 NOTE — H&P (Signed)
[facility]          History and Physical    PCP:   No primary provider on file.   Chief Complaint:  Left hip pain following fall  HPI: Patient is an 80 year old white man with history only significant for hypertension, who presents to the hospital today after a mechanical fall in his independent living facility today. He states he was in the shower and slipped hitting his left hip, because of immediate pain he was transferred to the emergency department where a hip x-ray demonstrates evidence for a subcapital left hip fracture. A CT scan of the head was also obtained which showed a hypodensity in the left cerebellar hemisphere which may reflect ischemic  change versus technical in nature. Repeat CT head is recommended. Dr. Berenice Primas, orthopedics has been consulted, with plans for surgical intervention later today. Hospitalist admission was requested.  Allergies:  No Known Allergies    Past Medical History  Diagnosis Date  . Hypertension   . Glaucoma   . Osteoporosis   . Hyperlipidemia     Past Surgical History  Procedure Laterality Date  . Cataract extraction, bilateral    . Transurethral resection of prostate      Prior to Admission medications   Medication Sig Start Date End Date Taking? Authorizing Provider  acetaminophen (TYLENOL) 500 MG tablet Take 500-1,000 mg by mouth every 6 (six) hours as needed for mild pain.   Yes Historical Provider, MD  aspirin 81 MG tablet Take 81 mg by mouth daily.   Yes Historical Provider, MD  Cholecalciferol (VITAMIN D) 2000 UNITS tablet Take 2,000 Units by mouth daily.   Yes Historical Provider, MD  Multiple Vitamins-Minerals (ICAPS) TABS Take 1 tablet by mouth daily.   Yes Historical Provider, MD  pilocarpine (PILOCAR) 4 % ophthalmic solution Place 1 drop into both eyes 4 (four) times daily.   Yes Historical Provider, MD  ramipril (ALTACE) 10 MG capsule Take 10 mg by mouth daily.   Yes Historical Provider, MD  timolol  (BETIMOL) 0.5 % ophthalmic solution Place 1 drop into both eyes daily.   Yes Historical Provider, MD    Social History:  reports that he has never smoked. He has never used smokeless tobacco. He reports that he does not drink alcohol or use illicit drugs.  History reviewed. No pertinent family history.  Review of Systems:  Constitutional: Denies fever, chills, diaphoresis, appetite change and fatigue.  HEENT: Denies photophobia, eye pain, redness, hearing loss, ear pain, congestion, sore throat, rhinorrhea, sneezing, mouth sores, trouble swallowing, neck pain, neck stiffness and tinnitus.   Respiratory: Denies SOB, DOE, cough, chest tightness,  and wheezing.   Cardiovascular: Denies chest pain, palpitations and leg swelling.  Gastrointestinal: Denies nausea, vomiting, abdominal pain, diarrhea, constipation, blood in stool and abdominal distention.  Genitourinary: Denies dysuria, urgency, frequency, hematuria, flank pain and difficulty urinating.  Endocrine: Denies: hot or cold intolerance, sweats, changes in hair or nails, polyuria, polydipsia. Musculoskeletal: Denies myalgias, back pain, joint swelling, arthralgias and gait problem.  Skin: Denies pallor, rash and wound.  Neurological: Denies dizziness, seizures, syncope, weakness, light-headedness, numbness and headaches.  Hematological: Denies adenopathy. Easy bruising, personal or family bleeding history  Psychiatric/Behavioral: Denies suicidal ideation, mood changes, confusion, nervousness, sleep disturbance and agitation   Physical Exam: Blood pressure 122/59, pulse 90, temperature 98.4 F (36.9 C), temperature source Oral, resp. rate 18, SpO2 96.00%. General: Alert, awake, oriented x3, in no distress. HEENT: Normocephalic, atraumatic, pupils equal and reactive to light, conjunctival  injection (which patient and son say are old and he is under the care of an ophthalmologist for), moist mucous membranes. Neck: Supple, no JVD, no  lymphadenopathy, no bruits, no goiter. Cardiovascular: Regular rate and rhythm, no murmurs, rubs or gallops. Lungs: Clear to auscultation bilaterally. Abdomen: Soft, nontender, nondistended, positive bowel sounds, no masses organomegaly noted. Extremities: No clubbing, cyanosis or edema, positive pedal pulses. Neurologic: Appears grossly intact and nonfocal.   Labs on Admission:  Results for orders placed during the hospital encounter of 08/26/13 (from the past 48 hour(s))  BASIC METABOLIC PANEL     Status: Abnormal   Collection Time    08/26/13 11:00 AM      Result Value Ref Range   Sodium 136 (*) 137 - 147 mEq/L   Potassium 4.0  3.7 - 5.3 mEq/L   Chloride 99  96 - 112 mEq/L   CO2 22  19 - 32 mEq/L   Glucose, Bld 127 (*) 70 - 99 mg/dL   BUN 21  6 - 23 mg/dL   Creatinine, Ser 0.71  0.50 - 1.35 mg/dL   Calcium 8.7  8.4 - 10.5 mg/dL   GFR calc non Af Amer 84 (*) >90 mL/min   GFR calc Af Amer >90  >90 mL/min   Comment: (NOTE)     The eGFR has been calculated using the CKD EPI equation.     This calculation has not been validated in all clinical situations.     eGFR's persistently <90 mL/min signify possible Chronic Kidney     Disease.  CBC WITH DIFFERENTIAL     Status: Abnormal   Collection Time    08/26/13 11:00 AM      Result Value Ref Range   WBC 6.9  4.0 - 10.5 K/uL   RBC 4.26  4.22 - 5.81 MIL/uL   Hemoglobin 13.4  13.0 - 17.0 g/dL   HCT 40.1  39.0 - 52.0 %   MCV 94.1  78.0 - 100.0 fL   MCH 31.5  26.0 - 34.0 pg   MCHC 33.4  30.0 - 36.0 g/dL   RDW 13.3  11.5 - 15.5 %   Platelets 149 (*) 150 - 400 K/uL   Neutrophils Relative % 85 (*) 43 - 77 %   Neutro Abs 5.9  1.7 - 7.7 K/uL   Lymphocytes Relative 6 (*) 12 - 46 %   Lymphs Abs 0.4 (*) 0.7 - 4.0 K/uL   Monocytes Relative 9  3 - 12 %   Monocytes Absolute 0.6  0.1 - 1.0 K/uL   Eosinophils Relative 0  0 - 5 %   Eosinophils Absolute 0.0  0.0 - 0.7 K/uL   Basophils Relative 0  0 - 1 %   Basophils Absolute 0.0  0.0 - 0.1  K/uL  PROTIME-INR     Status: None   Collection Time    08/26/13 11:00 AM      Result Value Ref Range   Prothrombin Time 13.1  11.6 - 15.2 seconds   INR 1.01  0.00 - 1.49  TYPE AND SCREEN     Status: None   Collection Time    08/26/13 11:00 AM      Result Value Ref Range   ABO/RH(D) O POS     Antibody Screen NEG     Sample Expiration 08/29/2013    ABO/RH     Status: None   Collection Time    08/26/13 11:00 AM      Result Value Ref  Range   ABO/RH(D) O POS      Radiological Exams on Admission: Dg Hip Complete Left  08/26/2013   CLINICAL DATA:  Fall.  EXAM: LEFT HIP - COMPLETE 2+ VIEW  COMPARISON:  None.  FINDINGS: Angulated subcapital femoral neck fracture is present on the left. Diffuse osteopenia and degenerative change present. Calcifications in pelvis consistent with phleboliths.  IMPRESSION: Angulated left subcapital femoral neck fracture.   Electronically Signed   By: Marcello Moores  Register   On: 08/26/2013 10:22   Ct Head Wo Contrast  08/26/2013   CLINICAL DATA:  Mental status change, status post fall.  EXAM: CT HEAD WITHOUT CONTRAST  TECHNIQUE: Contiguous axial images were obtained from the base of the skull through the vertex without intravenous contrast.  COMPARISON:  CT HEAD WO/W CM dated 01/29/2011  FINDINGS: There is mild diffuse cerebral and cerebellar atrophy with compensatory ventriculomegaly. There is no evidence of an acute intracranial hemorrhage. There is hypodensity in the left cerebellar hemisphere that is asymmetric with that on the right. This is demonstrated on images 6-8. This may reflect ischemic change. The brainstem exhibits no acute abnormality. There is decreased density in the deep white matter of both cerebral hemispheres consistent with chronic small vessel ischemic type change.  At bone window settings the observed portions of the paranasal sinuses and mastoid air cells are clear. There is no evidence of an acute skull fracture.  IMPRESSION: 1. There is no evidence  of an acute intracranial hemorrhage. 2. There is subtle hypodensity in the left cerebellar hemisphere asymmetric with the same region on the right. This may reflect evolving ischemic change but it may be technical in nature. 3. There is no evidence of an acute ischemic event involving the cerebrum. There is decreased density bilaterally in the deep white matter of the cerebrum consistent with chronic small vessel ischemic type change. Follow-up CT scanning or MRI is recommended if the patient's symptoms persist.   Electronically Signed   By: David  Martinique   On: 08/26/2013 11:28   Dg Chest Port 1 View  08/26/2013   CLINICAL DATA:  Left hip fracture  EXAM: PORTABLE CHEST - 1 VIEW  COMPARISON:  None.  FINDINGS: Study is limited by poor inspiration. Mild elevation of the right hemidiaphragm with right basilar atelectasis. No acute infiltrate or pulmonary edema. Atherosclerotic calcifications of thoracic spine.  IMPRESSION: No infiltrate or pulmonary edema. Mild elevation of the right hemidiaphragm with right basilar atelectasis   Electronically Signed   By: Lahoma Crocker M.D.   On: 08/26/2013 10:58    Assessment/Plan Principal Problem:   Subcapital fracture of left hip Active Problems:   HTN (hypertension)   Abnormal CT of brain   Closed left hip fracture   Subcapital fracture of the left hip -Management as per orthopedics. -Plan for surgical repair later today.  Hypertension -Well controlled. -Continue home dose of ACE inhibitor.  Abnormal CT scan of the brain -CT scan shows a hypodensity in the left cerebellar hemisphere which is likely technical in nature, but cannot rule out an acute ischemic change. -Patient denies any acute changes in mental status or balance difficulty, son corroborates this. -I would recommend repeating CT scan of the head in one to 2 days to confirm findings.  DVT prophylaxis -Subcutaneous heparin for now.  CODE STATUS -Full code.  Time Spent on Admission: 75  minutes  HERNANDEZ ACOSTA,ESTELA [facility] Pager: [phone] 08/26/2013, 2:46 PM

## 2013-08-26 NOTE — ED Notes (Signed)
He remains in no distress and is taken to x-ray at this time.

## 2013-08-26 NOTE — Progress Notes (Signed)
   CARE MANAGEMENT ED NOTE 08/26/2013  Patient:  Todd Whitaker,Todd Whitaker   Account Number:  [id number]  Date Initiated:  08/26/2013  Documentation initiated by:  Radford PaxFERRERO,Sherena Machorro  Subjective/Objective Assessment:   Patient presents to ED post fall sustaining fracture to left femoral neck.     Subjective/Objective Assessment Detail:   Patient with pmhx of htn glaucoma, osteoporosis, and hyperlipidemia.     Action/Plan:   Patient to be admiitted, orthopedic consult ordered.   Action/Plan Detail:   Anticipated DC Date:       Status Recommendation to Physician:   Result of Recommendation:    Other ED Services  Consult Working Plan    DC Planning Services  Other  PCP issues    Choice offered to / List presented to:            Status of service:  Completed, signed off  ED Comments:   ED Comments Detail:  EDCM spoke to patient and patient's family at bedside. Patient onfirms that his pcp is Dr. Catha GosselinKevin Little.  System updated.

## 2013-08-26 NOTE — ED Notes (Signed)
CMS remains intact to all extremities, including all toes left foot having immediate cap. Refill.

## 2013-08-26 NOTE — Transfer of Care (Signed)
Immediate Anesthesia Transfer of Care Note  Patient: Todd Whitaker  Procedure(s) Performed: Procedure(s): ARTHROPLASTY BIPOLAR HIP (Left)  Patient Location: PACU  Anesthesia Type:General  Level of Consciousness: awake, patient cooperative and responds to stimulation  Airway & Oxygen Therapy: Patient Spontanous Breathing and Patient connected to face mask oxygen  Post-op Assessment: Report given to PACU RN, Post -op Vital signs reviewed and stable and Patient moving all extremities X 4  Post vital signs: stable  Complications: No apparent anesthesia complications

## 2013-08-26 NOTE — ED Notes (Signed)
He tells me he fell in the bathtub--not sure if this morning, or yesterday evening.  He c/o persistent left hip pain.  He is in no distress and is somewhat confused, which he family assures me is his normal mentation baseline.  His speech is clear.  CMS intact all toes bilat.

## 2013-08-26 NOTE — Anesthesia Preprocedure Evaluation (Addendum)
Anesthesia Evaluation  Patient identified by MRN, date of birth, ID band Patient awake    Reviewed: Allergy & Precautions, H&P , NPO status , Patient's Chart, lab work & pertinent test results  Airway Mallampati: II TM Distance: >3 FB Neck ROM: Full    Dental no notable dental hx.    Pulmonary neg pulmonary ROS,  breath sounds clear to auscultation  Pulmonary exam normal       Cardiovascular hypertension, Pt. on medications negative cardio ROS  Rhythm:Regular Rate:Normal     Neuro/Psych negative neurological ROS  negative psych ROS   GI/Hepatic negative GI ROS, Neg liver ROS,   Endo/Other  negative endocrine ROS  Renal/GU negative Renal ROS  negative genitourinary   Musculoskeletal negative musculoskeletal ROS (+)   Abdominal   Peds negative pediatric ROS (+)  Hematology negative hematology ROS (+)   Anesthesia Other Findings   Reproductive/Obstetrics negative OB ROS                          Anesthesia Physical Anesthesia Plan  ASA: III  Anesthesia Plan: General   Post-op Pain Management:    Induction: Intravenous  Airway Management Planned: Oral ETT  Additional Equipment:   Intra-op Plan:   Post-operative Plan: Extubation in OR  Informed Consent: I have reviewed the patients History and Physical, chart, labs and discussed the procedure including the risks, benefits and alternatives for the proposed anesthesia with the patient or authorized representative who has indicated his/her understanding and acceptance.   Dental advisory given  Plan Discussed with: CRNA  Anesthesia Plan Comments: (Discussed general versus spinal with patient's son. Plan general.)       Anesthesia Quick Evaluation

## 2013-08-27 ENCOUNTER — Inpatient Hospital Stay (HOSPITAL_COMMUNITY): Payer: Medicare Other

## 2013-08-27 ENCOUNTER — Encounter (HOSPITAL_COMMUNITY): Payer: Self-pay | Admitting: Internal Medicine

## 2013-08-27 DIAGNOSIS — E871 Hypo-osmolality and hyponatremia: Secondary | ICD-10-CM

## 2013-08-27 DIAGNOSIS — D696 Thrombocytopenia, unspecified: Secondary | ICD-10-CM

## 2013-08-27 DIAGNOSIS — D62 Acute posthemorrhagic anemia: Secondary | ICD-10-CM

## 2013-08-27 DIAGNOSIS — S72009A Fracture of unspecified part of neck of unspecified femur, initial encounter for closed fracture: Secondary | ICD-10-CM

## 2013-08-27 DIAGNOSIS — R5082 Postprocedural fever: Secondary | ICD-10-CM

## 2013-08-27 LAB — URINALYSIS, ROUTINE W REFLEX MICROSCOPIC
BILIRUBIN URINE: NEGATIVE
Glucose, UA: NEGATIVE mg/dL
KETONES UR: NEGATIVE mg/dL
NITRITE: NEGATIVE
PROTEIN: 30 mg/dL — AB
SPECIFIC GRAVITY, URINE: 1.026 (ref 1.005–1.030)
UROBILINOGEN UA: 0.2 mg/dL (ref 0.0–1.0)
pH: 6 (ref 5.0–8.0)

## 2013-08-27 LAB — BASIC METABOLIC PANEL
BUN: 14 mg/dL (ref 6–23)
CHLORIDE: 99 meq/L (ref 96–112)
CO2: 22 meq/L (ref 19–32)
Calcium: 8 mg/dL — ABNORMAL LOW (ref 8.4–10.5)
Creatinine, Ser: 0.73 mg/dL (ref 0.50–1.35)
GFR calc Af Amer: >90 mL/min (ref >90)
GFR calc non Af Amer: 83 mL/min — ABNORMAL LOW (ref >90)
Glucose, Bld: 159 mg/dL — ABNORMAL HIGH (ref 70–99)
POTASSIUM: 4.1 meq/L (ref 3.7–5.3)
Sodium: 133 mEq/L — ABNORMAL LOW (ref 137–147)

## 2013-08-27 LAB — CBC
HCT: 36.5 % — ABNORMAL LOW (ref 39.0–52.0)
Hemoglobin: 12.2 g/dL — ABNORMAL LOW (ref 13.0–17.0)
MCH: 31.5 pg (ref 26.0–34.0)
MCHC: 33.4 g/dL (ref 30.0–36.0)
MCV: 94.3 fL (ref 78.0–100.0)
PLATELETS: 139 10*3/uL — AB (ref 150–400)
RBC: 3.87 MIL/uL — ABNORMAL LOW (ref 4.22–5.81)
RDW: 13.3 % (ref 11.5–15.5)
WBC: 9.5 10*3/uL (ref 4.0–10.5)

## 2013-08-27 LAB — URINE MICROSCOPIC-ADD ON

## 2013-08-27 LAB — SURGICAL PCR SCREEN
MRSA, PCR: INVALID — AB
STAPHYLOCOCCUS AUREUS: INVALID — AB

## 2013-08-27 MED ORDER — SENNA 8.6 MG PO TABS
2.0000 | ORAL_TABLET | Freq: Every day | ORAL | Status: DC
  Administered 2013-08-27 – 2013-08-30 (×4): 17.2 mg via ORAL

## 2013-08-27 MED ORDER — MENTHOL 3 MG MT LOZG: 1.0000 | LOZENGE | OROMUCOSAL | Status: DC | PRN

## 2013-08-27 MED ORDER — POLYETHYLENE GLYCOL 3350 17 G PO PACK: 17.0000 g | PACK | Freq: Every day | ORAL | Status: DC | PRN

## 2013-08-27 MED ORDER — PHENOL 1.4 % MT LIQD
1.0000 | OROMUCOSAL | Status: DC | PRN
  Filled 2013-08-27: qty 177

## 2013-08-27 MED ORDER — BISACODYL 10 MG RE SUPP: 10.0000 mg | Freq: Every day | RECTAL | Status: DC | PRN

## 2013-08-27 NOTE — Progress Notes (Signed)
Clinical Social Work Department BRIEF PSYCHOSOCIAL ASSESSMENT 08/27/2013  Patient:  Todd Whitaker, Todd Whitaker     Account Number:  [id number]     Admit date:  08/26/2013  Clinical Social Worker:  Lacie Scotts  Date/Time:  08/27/2013 08:59 AM  Referred by:  Physician  Date Referred:  08/27/2013 Referred for  SNF Placement   Other Referral:   Interview type:  Patient Other interview type:    PSYCHOSOCIAL DATA Living Status:  FACILITY Admitted from facility:  [facility] Level of care:  Independent Living Primary support name:  Makale Pindell Primary support relationship to patient:  CHILD, ADULT Degree of support available:   supportive    CURRENT CONCERNS Current Concerns  Post-Acute Placement   Other Concerns:    SOCIAL WORK ASSESSMENT / PLAN Pt is an 80 yr old gentleman admitted from [facility] . Pt fell at home and fx his hip. Surgery was done last night. CSW met with pt / son today to assist with d/c planning.ST Rehab will be needed following hospital d/c. Pt would like placement at [facility]. SNF has been contacted and clinicals are being reviewed. [place] search has been initiated in case [facility] is unable to assist. Bed offers will be provided as received.   Assessment/plan status:  Psychosocial Support/Ongoing Assessment of Needs Other assessment/ plan:   Information/referral to community resources:   SNF list with bed offers to be provided.    PATIENT'S/FAMILY'S RESPONSE TO PLAN OF CARE: Pt is hoping [facility] will have an opening for him at d/c. He understands that rehab is needed due to his fx.   Werner Lean LCSW [phone]

## 2013-08-27 NOTE — Progress Notes (Signed)
   CARE MANAGEMENT NOTE 08/27/2013  Patient:  Lynnda ChildFURR,Peyten D   Account Number:  [id number]  Date Initiated:  08/27/2013  Documentation initiated by:  [facility] LLCHAVIS,Nakaya Mishkin  Subjective/Objective Assessment:   ARTHROPLASTY BIPOLAR HIP     [facility]   Anticipated DC Date:  08/29/2013   Anticipated DC Plan:  SKILLED NURSING FACILITY  In-house referral  Clinical Social Worker      DC Planning Services  CM consult      Choice offered to / List presented to:             Status of service:  Completed, signed off Medicare Important Message given?   (If response is "NO", the following Medicare IM given date fields will be blank) Date Medicare IM given:   Date Additional Medicare IM given:    Discharge Disposition:  SKILLED NURSING FACILITY  Per UR Regulation:    If discussed at Long Length of Stay Meetings, dates discussed:    Comments:  No NCM needs identified. Plan is dc to SNF when medically ready. Isidoro DonningAlesia Clella Mckeel RN CCM Case Mgmt phone [phone]

## 2013-08-27 NOTE — Progress Notes (Signed)
Subjective: 1 Day Post-Op Procedure(s) (LRB): ARTHROPLASTY BIPOLAR HIP (Left) Patient reports pain as 3 on 0-10 scale.  Some mild confusion,probably at baseline. Taking [po ok. Up in chair.   Objective: Vital signs in last 24 hours: Temp:  [98.1 F (36.7 C)-100.5 F (38.1 C)] 98.4 F (36.9 C) (02/26 0957) Pulse Rate:  [88-110] 100 (02/26 0957) Resp:  [13-23] 18 (02/26 1230) BP: (128-174)/(62-90) 132/68 mmHg (02/26 1230) SpO2:  [91 %-99 %] 96 % (02/26 1230) Weight:  [62.596 kg (138 lb)] 62.596 kg (138 lb) (02/25 1600)  Intake/Output from previous day: 02/25 0701 - 02/26 0700 In: 2708.3 [I.V.:2708.3] Out: 600 [Urine:600] Intake/Output this shift: Total I/O In: 174.2 [I.V.:174.2] Out: -    Recent Labs  08/26/13 1100 08/27/13 0350  HGB 13.4 12.2*    Recent Labs  08/26/13 1100 08/27/13 0350  WBC 6.9 9.5  RBC 4.26 3.87*  HCT 40.1 36.5*  PLT 149* 139*    Recent Labs  08/26/13 1100 08/27/13 0350  NA 136* 133*  K 4.0 4.1  CL 99 99  CO2 22 22  BUN 21 14  CREATININE 0.71 0.73  GLUCOSE 127* 159*  CALCIUM 8.7 8.0*    Recent Labs  08/26/13 1100  INR 1.01  left hip exam:  Neurovascular intact Sensation intact distally Intact pulses distally Dorsiflexion/Plantar flexion intact Incision: dressing C/D/I Compartment soft  Assessment/Plan: 1 Day Post-Op Procedure(s) (LRB): ARTHROPLASTY BIPOLAR HIP (Left) Plan: Cont to mobilize with PT D/C foley. Will get UA and urine cx Convert IV to saline lock. Cont oral ASA 325mg  BID with SCD's for VTE prophylaxis Discharge to SNF possibly Sat  Todd Whitaker G 08/27/2013, 3:02 PM

## 2013-08-27 NOTE — Progress Notes (Signed)
Clinical Social Work Department CLINICAL SOCIAL WORK PLACEMENT NOTE 08/27/2013  Patient:  [name],Todd D  Account Number:  [id number] Admit date:  08/26/2013  Clinical Social Worker:  Cori RazorJAMIE Chae Shuster, LCSW  Date/time:  08/27/2013 03:37 PM  Clinical Social Work is seeking post-discharge placement for this patient at the following level of care:   SKILLED NURSING   (*CSW will update this form in Epic as items are completed)     Patient/family provided with [facility] Work's list of facilities offering this level of care within the geographic area requested by the patient (or if unable, by the patient's family).  08/27/2013  Patient/family informed of their freedom to choose among providers that offer the needed level of care, that participate in Medicare, Medicaid or managed care program needed by the patient, have an available bed and are willing to accept the patient.    Patient/family informed of [facility]' ownership interest in [facility], as well as of the fact that they are under no obligation to receive care at this facility.  PASARR submitted to EDS on 08/27/2013 PASARR number received from EDS on 08/27/2013  FL2 transmitted to all facilities in geographic area requested by pt/family on  08/27/2013 FL2 transmitted to all facilities within larger geographic area on   Patient informed that his/her managed care company has contracts with or will negotiate with  certain facilities, including the following:     Patient/family informed of bed offers received:   Patient chooses bed at  Physician recommends and patient chooses bed at    Patient to be transferred to  on   Patient to be transferred to facility by   The following physician request were entered in Epic:   Additional Comments:  Cori RazorJamie Ranesha Val LCSW [phone]

## 2013-08-27 NOTE — Evaluation (Signed)
Physical Therapy Evaluation Patient Details Name: Todd Whitaker MRN: [id number] DOB: August 23, 1927 Today's Date: 08/27/2013 Time: [id number] PT Time Calculation (min): 20 min  PT Assessment / Plan / Recommendation History of Present Illness  s/p L hip hemiarthroplasty after sustaining L femoral neck fx due to fall at ILF  Clinical Impression  Patient is s/p above surgery resulting in functional limitations due to the deficits listed below (see PT Problem List). Patient will benefit from skilled PT to increase their independence and safety with mobility to allow discharge to the venue listed below.  Pt mobilizing fairly well until decreased responses end of ambulation requiring immediate recliner and RN and staff into room.  Pt alert and talking again upon leaving room in reclined position with pillow between legs.  Reviewed posterior hip precautions with pt and son prior to mobilizing.     PT Assessment  Patient needs continued PT services    Follow Up Recommendations  SNF    Does the patient have the potential to tolerate intense rehabilitation      Barriers to Discharge        Equipment Recommendations  None recommended by PT    Recommendations for Other Services     Frequency Min 3X/week    Precautions / Restrictions Precautions Precautions: Fall;Posterior Hip Precaution Comments: pt and son educated on posterior hip precautions and handout provided Restrictions Other Position/Activity Restrictions: WBAT   Pertinent Vitals/Pain C/o L hip pain with movement, better with rest, RN in to give some pain meds, repositioned      Mobility  Bed Mobility Overal bed mobility: Needs Assistance;+2 for physical assistance Bed Mobility: Supine to Sit Supine to sit: Max assist;+2 for physical assistance General bed mobility comments: verbal cues for technique and maintaining precautions Transfers Overall transfer level: Needs assistance Equipment used: Rolling walker (2  wheeled) Transfers: Sit to/from Stand Sit to Stand: Mod assist General transfer comment: verbal cues for safe technique, more assist upon sitting due to pt not verbally responding upon return to room so quickly pulled up recliner and assisted pt to sitting Ambulation/Gait Ambulation/Gait assistance: Min assist Ambulation Distance (Feet): 40 Feet Assistive device: Rolling walker (2 wheeled) Gait Pattern/deviations: Step-to pattern;Antalgic Gait velocity: decr General Gait Details: verbal cues for sequence, step length, RW distance    Exercises     PT Diagnosis: Difficulty walking;Acute pain  PT Problem List: Decreased strength;Pain;Decreased mobility;Decreased activity tolerance;Decreased knowledge of use of DME;Decreased cognition;Decreased knowledge of precautions PT Treatment Interventions: DME instruction;Gait training;Functional mobility training;Therapeutic activities;Therapeutic exercise;Patient/family education     PT Goals(Current goals can be found in the care plan section) Acute Rehab PT Goals PT Goal Formulation: With patient/family Time For Goal Achievement: 09/03/13 Potential to Achieve Goals: Good  Visit Information  Last PT Received On: 08/27/13 Assistance Needed: +2 History of Present Illness: s/p L hip hemiarthroplasty after sustaining L femoral neck fx due to fall at ILF       Prior Functioning  Home Living Family/patient expects to be discharged to:: Skilled nursing facility Living Arrangements:  (usually lives with spouse at ILF) Home Equipment: Environmental consultantWalker - 2 wheels Prior Function Level of Independence: Independent with assistive device(s) Communication Communication: No difficulties    Cognition  Cognition Arousal/Alertness: Awake/alert Behavior During Therapy: WFL for tasks assessed/performed Overall Cognitive Status: History of cognitive impairments - at baseline (son reports poor cognition prior to admission however no hx of dementia in chart)     Extremity/Trunk Assessment Lower Extremity Assessment Lower Extremity Assessment: LLE deficits/detail LLE  Deficits / Details: functional hip weakness observed with mobility, assist required for against gravity during transfers   Balance    End of Session PT - End of Session Activity Tolerance: Other (comment);Treatment limited secondary to medical complications (Comment) (limited response upon end of ambulation requiring immediate reclined position) Patient left: with call bell/phone within reach;in chair;with family/visitor present Nurse Communication: Mobility status (called into room to assist with pt assessment due to decreased response with mobility)  GP     Todd Whitaker,Todd Whitaker 08/27/2013, 1:58 PM Zenovia Jarred, PT, DPT 08/27/2013 Pager: [phone]

## 2013-08-27 NOTE — Progress Notes (Signed)
Patient up with therapy , suddenly stopped talking, closed eyes, assisted to chair, b/p 128/62, sat 94 hr 80, talking now, alert and appropriate , will monitor  D [employer] SystemsFrank[employer] RN

## 2013-08-27 NOTE — Progress Notes (Signed)
Physical Therapy Treatment Note   08/27/13 1600  PT Visit Information  Last PT Received On 08/27/13  Assistance Needed +2  History of Present Illness s/p L hip hemiarthroplasty after sustaining L femoral neck fx due to fall at ILF  PT Time Calculation  PT Start Time 1449  PT Stop Time 1507  PT Time Calculation (min) 18 min  Subjective Data  Subjective Pt more confused this afternoon.  Pt ambulated short distance then assisted back to bed.  Pt frequently attempting to pull out foley.  RN into room end of session and aware.  Precautions  Precautions Fall;Posterior Hip  Precaution Comments pt cued for hip precautions as mobility performed due to cognition  Restrictions  Other Position/Activity Restrictions WBAT  Cognition  Arousal/Alertness Awake/alert  Behavior During Therapy Restless  Overall Cognitive Status Impaired/Different from baseline  Area of Impairment Safety/judgement;Following commands;Memory  Memory Decreased short-term memory;Decreased recall of precautions  Following Commands Follows one step commands inconsistently  Safety/Judgement Decreased awareness of safety;Decreased awareness of deficits  General Comments perseverating on need to urinate despite foley catheter  Bed Mobility  Overal bed mobility Needs Assistance;+2 for physical assistance  Bed Mobility Sit to Supine  Sit to supine Total assist;+2 for physical assistance;+2 for safety/equipment  General bed mobility comments verbal cues for technique however pt with difficultly following commands so assisted back to bed  Transfers  Overall transfer level Needs assistance  Equipment used Rolling walker (2 wheeled)  Transfers Sit to/from Stand  Sit to Stand +2 physical assistance;Mod assist  General transfer comment verbal cues for safe technique within precautions  Ambulation/Gait  Ambulation/Gait assistance +2 physical assistance;Mod assist  Ambulation Distance (Feet) 25 Feet  Assistive device Rolling walker  (2 wheeled)  Gait Pattern/deviations Step-to pattern;Trunk flexed  Gait velocity decr  General Gait Details multimodal cues for sequence, step length, RW distance  PT - End of Session  Equipment Utilized During Treatment Gait belt  Activity Tolerance Other (comment) (decreased cognition, confusion)  Patient left in bed;with call bell/phone within reach;with nursing/sitter in room  PT - Assessment/Plan  PT Plan Current plan remains appropriate  PT Frequency Min 3X/week  Follow Up Recommendations SNF  PT equipment None recommended by PT  PT Goal Progression  Progress towards PT goals Not progressing toward goals - comment (due to decreased cognition, confusion)  PT General Charges  $$ ACUTE PT VISIT 1 Procedure  PT Treatments  $Gait Training 8-22 mins   Zenovia JarredKati Paralee Pendergrass, PT, DPT 08/27/2013 Pager: [phone]

## 2013-08-27 NOTE — Progress Notes (Signed)
Pt has hx of trauma to Intact Foley Catheter in perioperative period. Clots noted in drainage bag per report from last pm. Foley discontinued today d/t pt agitation and post op protocol. Unable to void. Bladder scanned for >200cc.pt assisted to edge of bed and then standing (2+ Max assist). Unable to void. Son requested "condom cath" which was placed.  Pt declines straight I/O cath. Denies discomfort. Son, at bedside, reports low po intake. Will reassess in am.

## 2013-08-27 NOTE — Progress Notes (Signed)
UR completed. Tamana Hatfield RN CCM Case Mgmt phone [phone] 

## 2013-08-27 NOTE — Progress Notes (Addendum)
[facility] PROGRESS NOTE  Todd Whitaker WJX:[id number] DOB: 07-31-1927 DOA: 08/26/2013 PCP: Mickie Hillier, MD  Assessment/Plan   Subcapital fracture of the left hip s/p left hip arthroplasty on 2/25 by Dr. Luiz Blare -  DVT proph and weight bearing status per Ortho -  Pain control per ortho -  Appreciate orthopedic assistance -  PT/OT rec SNF -  SW to assist with SNF placement  Hypertension, blood pressure trending down towards normal range today -Continue home dose of ACE inhibitor  Abnormal CT scan of the brain:  hypodensity in the left cerebellar hemisphere which is likely technical in nature, but cannot rule out an acute ischemic change.  -  Son describes TIA-like episode a few months ago that results in balance problems.   -  Repeat CT brain   Dementia -  Defer work up for reversible causes to PCP -  Lights on during day and off at night -  Frequent reorientation  Hx of SIADH and sodium trending down slightly -  Judicious use of IVF -  Repeat BMP in AM  Post-operative fever, transient and mild -  Monitor clinically for evidence of infection -  Continue DVT prophylaxis  Acute blood loss anemia   -  Started iron supplementation -  Repeat CBC in AM  Mild thrombocytopenia, acute phase reactant and possibly some consumption due to recent surgery -  Repeat CBC in AM  Diet:  Regular  Access:  PIV IVF:  Until tolerating PO Proph:  ASA 325mg  BID + SCDs  Code Status: full Family Communication: spoke with patient and his son Disposition Plan: to SNF, possibly as early as tomorrow if doing well   Consultants:  Dr. Luiz Blare, orthopedics  Procedures:  Left hip hemiarthroplasty 2/25  Antibiotics:  perioperative   HPI/Subjective:  Pain in his left leg, but otherwise feeling well.  Last BM was post-operative.  Denies confusion, speech difficulties, focal numbness or weakness except for limitation of left leg ROM 2/2 pain.    Objective: Filed Vitals:   08/27/13 0510 08/27/13 0957 08/27/13 1125 08/27/13 1230  BP: 134/79 158/71 128/62 132/68  Pulse: 88 100    Temp: 98.1 F (36.7 C) 98.4 F (36.9 C)    TempSrc: Oral Oral    Resp: 20 18 18 18   Height:      Weight:      SpO2: 97% 92% 96% 96%    Intake/Output Summary (Last 24 hours) at 08/27/13 1429 Last data filed at 08/27/13 1000  Gross per 24 hour  Intake 2882.5 ml  Output    600 ml  Net 2282.5 ml   Filed Weights   08/26/13 1600  Weight: 62.596 kg (138 lb)    Exam:   General:  CM, No acute distress  HEENT:  NCAT, MMM  Cardiovascular:  RRR, nl S1, S2 no mrg, 2+ pulses, warm extremities  Respiratory:  CTAB, no increased WOB  Abdomen:   NABS, soft, NT/ND  MSK:   Normal tone and bulk, no LEE, decrease ROM left leg.  Knee braced.  Bandage left lateral hip c/d/i.  Minimal swelling and bruising.  Neuro:  Grossly intact  Data Reviewed: Basic Metabolic Panel:  Recent Labs Lab 08/26/13 1100 08/27/13 0350  NA 136* 133*  K 4.0 4.1  CL 99 99  CO2 22 22  GLUCOSE 127* 159*  BUN 21 14  CREATININE 0.71 0.73  CALCIUM 8.7 8.0*   Liver Function Tests: No results found for this basename: AST, ALT, ALKPHOS, BILITOT, PROT,  ALBUMIN,  in the last 168 hours No results found for this basename: LIPASE, AMYLASE,  in the last 168 hours No results found for this basename: AMMONIA,  in the last 168 hours CBC:  Recent Labs Lab 08/26/13 1100 08/27/13 0350  WBC 6.9 9.5  NEUTROABS 5.9  --   HGB 13.4 12.2*  HCT 40.1 36.5*  MCV 94.1 94.3  PLT 149* 139*   Cardiac Enzymes: No results found for this basename: CKTOTAL, CKMB, CKMBINDEX, TROPONINI,  in the last 168 hours BNP (last 3 results) No results found for this basename: PROBNP,  in the last 8760 hours CBG: No results found for this basename: GLUCAP,  in the last 168 hours  Recent Results (from the past 240 hour(s))  SURGICAL PCR SCREEN     Status: Abnormal   Collection Time    08/26/13  4:15 PM      Result Value Ref  Range Status   MRSA, PCR INVALID RESULTS, SPECIMEN SENT FOR CULTURE (*) NEGATIVE Final   Comment: Chinita PesterCALLED A FARGO RN [phone] 08/26/13 A NAVARRO   Staphylococcus aureus INVALID RESULTS, SPECIMEN SENT FOR CULTURE (*) NEGATIVE Final   Comment:            The Xpert SA Assay (FDA     approved for NASAL specimens     in patients over 78 years of age),     is one component of     a comprehensive surveillance     program.  Test performance has     been validated by [facility] for patients greater     than or equal to 78 year old.     It is not intended     to diagnose infection nor to     guide or monitor treatment.  SURGICAL PCR SCREEN     Status: Abnormal   Collection Time    08/27/13  3:06 AM      Result Value Ref Range Status   MRSA, PCR INVALID RESULTS, SPECIMEN SENT FOR CULTURE (*) NEGATIVE Final   Comment: INFORMED D. HOLCOMB RN AT [id number] ON 02.26.15 BY SHUEA   Staphylococcus aureus INVALID RESULTS, SPECIMEN SENT FOR CULTURE (*) NEGATIVE Final   Comment: INFORMED D. HOLCOMB RN AT [id number] ON 02.26.15 BY SHUEA                The Xpert SA Assay (FDA     approved for NASAL specimens     in patients over 78 years of age),     is one component of     a comprehensive surveillance     program.  Test performance has     been validated by [facility] for patients greater     than or equal to 78 year old.     It is not intended     to diagnose infection nor to     guide or monitor treatment.     Studies: Dg Hip Complete Left  08/26/2013   CLINICAL DATA:  Fall.  EXAM: LEFT HIP - COMPLETE 2+ VIEW  COMPARISON:  None.  FINDINGS: Angulated subcapital femoral neck fracture is present on the left. Diffuse osteopenia and degenerative change present. Calcifications in pelvis consistent with phleboliths.  IMPRESSION: Angulated left subcapital femoral neck fracture.   Electronically Signed   By: Maisie Fushomas  Register   On: 08/26/2013 10:22   Ct Head Wo Contrast  08/26/2013   CLINICAL DATA:  Mental  status change, status post fall.  EXAM: CT HEAD WITHOUT CONTRAST  TECHNIQUE: Contiguous axial images were obtained from the base of the skull through the vertex without intravenous contrast.  COMPARISON:  CT HEAD WO/W CM dated 01/29/2011  FINDINGS: There is mild diffuse cerebral and cerebellar atrophy with compensatory ventriculomegaly. There is no evidence of an acute intracranial hemorrhage. There is hypodensity in the left cerebellar hemisphere that is asymmetric with that on the right. This is demonstrated on images 6-8. This may reflect ischemic change. The brainstem exhibits no acute abnormality. There is decreased density in the deep white matter of both cerebral hemispheres consistent with chronic small vessel ischemic type change.  At bone window settings the observed portions of the paranasal sinuses and mastoid air cells are clear. There is no evidence of an acute skull fracture.  IMPRESSION: 1. There is no evidence of an acute intracranial hemorrhage. 2. There is subtle hypodensity in the left cerebellar hemisphere asymmetric with the same region on the right. This may reflect evolving ischemic change but it may be technical in nature. 3. There is no evidence of an acute ischemic event involving the cerebrum. There is decreased density bilaterally in the deep white matter of the cerebrum consistent with chronic small vessel ischemic type change. Follow-up CT scanning or MRI is recommended if the patient's symptoms persist.   Electronically Signed   By: David  [place]   On: 08/26/2013 11:28   Dg Pelvis Portable  08/26/2013   CLINICAL DATA:  Postop left hip surgery.  EXAM: PORTABLE PELVIS 1-2 VIEWS  COMPARISON:  None.  FINDINGS: New left hip prosthesis is well-seated and aligned. No evidence of an operative complication.   Electronically Signed   By: Amie Portland M.D.   On: 08/26/2013 21:32   Dg Chest Port 1 View  08/26/2013   CLINICAL DATA:  Left hip fracture  EXAM: PORTABLE CHEST - 1 VIEW   COMPARISON:  None.  FINDINGS: Study is limited by poor inspiration. Mild elevation of the right hemidiaphragm with right basilar atelectasis. No acute infiltrate or pulmonary edema. Atherosclerotic calcifications of thoracic spine.  IMPRESSION: No infiltrate or pulmonary edema. Mild elevation of the right hemidiaphragm with right basilar atelectasis   Electronically Signed   By: Natasha Mead M.D.   On: 08/26/2013 10:58   Dg Hip Portable 1 View Left  08/26/2013   CLINICAL DATA:  Postop left hip arthroplasty.  EXAM: PORTABLE LEFT HIP - 1 VIEW  COMPARISON:  None.  FINDINGS: New left hip prosthesis is well-seated and aligned There is no acute fracture or evidence of an operative complication.   Electronically Signed   By: Amie Portland M.D.   On: 08/26/2013 21:33    Scheduled Meds: . aspirin EC  325 mg Oral BID PC  . docusate sodium  100 mg Oral BID  . ferrous sulfate  325 mg Oral Q breakfast  . pilocarpine  1 drop Both Eyes QID  . ramipril  10 mg Oral Daily  . timolol  1 drop Both Eyes Daily   Continuous Infusions: . sodium chloride 50 mL/hr at 08/26/13 2142    Principal Problem:   Subcapital fracture of left hip Active Problems:   HTN (hypertension)   Abnormal CT of brain   Closed left hip fracture    Time spent: 30 min    Rebeccah Ivins, [facility]  [facility] Pager [phone]. If 7PM-7AM, please contact night-coverage at [email or web address], password [facility] 08/27/2013, 2:29 PM  LOS: 1 day

## 2013-08-27 NOTE — Op Note (Signed)
Todd Whitaker, Todd Whitaker                ACCOUNT NO.:  [id number]  MEDICAL RECORD NO.:  [id number]  LOCATION:  1611                         FACILITY:  [facility]  PHYSICIAN:  Todd Whitaker, M.D.   DATE OF BIRTH:  11-03-1927  DATE OF PROCEDURE: DATE OF DISCHARGE:                              OPERATIVE REPORT   PREOPERATIVE DIAGNOSIS:  Femoral neck fracture, left.  POSTOPERATIVE DIAGNOSIS:  Femoral neck fracture, left.  PROCEDURE:  Left hemiarthroplasty with a Summit basic stem, cemented size 6 with a 50 mm, -3 hip ball  monopolar.  SURGEON:  Todd Whitaker, M.D.  ASSISTANT:  Marshia Ly, PA.  ANESTHESIA:  General.  BRIEF HISTORY:  [name] is a 80 year old male, who fell earlier today and suffered a femoral neck fracture.  We were consulted for evaluation and was admitted via Medicine, and taken the operating room for hemiarthroplasty after long discussion with the patient and his family.  PROCEDURE IN DETAIL:  The patient was taken to the operating room after adequate anesthesia was used with general anesthetic, the patient was placed supine on the operating table, moved in the right lateral decubitus position and all bony prominence well padded.  Attention was turned to the left hip, where after routine prep and drape, the incision was made for posterior approach to the hip and  subcu tissues.  Then, the level of the tensor fascia was divided in line with fibers, and following this, a posterior approach was made.  The piriformis and posterior capsule were taken down as well short external rotators and tagged.  Once this was completed, attention was turned towards the hip ball where a provisional neck cut was made, followed by removal of the hip ball, and measured on the back table to 50.  A 50 ball and a stick was placed into the acetabulum and got excellent suction and fit range of motion.  Following this, attention was turned to the stem side, a cookie cutter was used,  introducer followed by a canal finer followed by lateralizer followed by broaching.  We broached up to a 5 and removed a decent fit, broached to a 6, 6 really had struggle getting down.  I finally got the 6 broach down and thought it fit well and  trialed it. Then, upon dislocating the hip, we felt that there was some wiggle room. At that point, we decided to cement in a 6 cement.  The tools were opened and a cement restrictor was opened and placed.  Cement tools were then placed and the size 6 stem was cemented into place, after the cement was mixed on the back table.  Once this cement was allowed to completely harden, we trialed them with a -3 neck ball with a 50 and got excellent range of motion and excellent stability.  At this point, the wound was copiously and thoroughly irrigated and suctioned dry.  Final irrigation of acetabulum was undertaken.  Looked at it, it looked to be pristine without evidence of arthritic change.  At this point, we placed a final neck head segment and reduced the hip and put it through a range of motion, excellent stability.  Repaired the short  external rotators piriformis to the posterior intertrochanteric line through drill holes. I closed the tensor fascia with 1 Vicryl running, skin with 0 and 2-0 Vicryl, and skin staples.  Sterile compressive dressing was applied as well as the knee immobilizer.  The patient was taken to recovery room where he was noted to be in satisfactory condition.  The estimated blood loss for the procedure was looked to be about 300 mL.  The final counts can be gotten from anesthesia records.     Todd JuniorJohn L. Anniya Whitaker, M.D.     Ranae PlumberJLG/MEDQ  D:  08/26/2013  T:  08/27/2013  Job:  [id number]

## 2013-08-27 NOTE — Progress Notes (Signed)
OT Cancellation Note  Patient Details Name: Todd Whitaker MRN: [id number] DOB: 1928-03-02   Cancelled Treatment:    Reason Eval/Treat Not Completed: Other (comment). Not ready for OT yet; checked with PT.  Will return tomorrow.    Todd Whitaker 08/27/2013, 12:24 PM Marica OtterMaryellen Elpidio Thielen, OTR/L [phone] 08/27/2013

## 2013-08-28 ENCOUNTER — Encounter (HOSPITAL_COMMUNITY): Payer: Self-pay | Admitting: Orthopedic Surgery

## 2013-08-28 ENCOUNTER — Inpatient Hospital Stay (HOSPITAL_COMMUNITY): Payer: Medicare Other

## 2013-08-28 DIAGNOSIS — R1310 Dysphagia, unspecified: Secondary | ICD-10-CM

## 2013-08-28 DIAGNOSIS — J690 Pneumonitis due to inhalation of food and vomit: Secondary | ICD-10-CM

## 2013-08-28 LAB — URINE CULTURE
COLONY COUNT: NO GROWTH
Culture: NO GROWTH
SPECIAL REQUESTS: NORMAL

## 2013-08-28 LAB — BASIC METABOLIC PANEL
BUN: 22 mg/dL (ref 6–23)
CHLORIDE: 97 meq/L (ref 96–112)
CO2: 23 mEq/L (ref 19–32)
Calcium: 8.2 mg/dL — ABNORMAL LOW (ref 8.4–10.5)
Creatinine, Ser: 0.77 mg/dL (ref 0.50–1.35)
GFR calc non Af Amer: 82 mL/min — ABNORMAL LOW (ref >90)
Glucose, Bld: 128 mg/dL — ABNORMAL HIGH (ref 70–99)
POTASSIUM: 3.8 meq/L (ref 3.7–5.3)
SODIUM: 133 meq/L — AB (ref 137–147)

## 2013-08-28 LAB — CBC
HCT: 34.4 % — ABNORMAL LOW (ref 39.0–52.0)
Hemoglobin: 11.9 g/dL — ABNORMAL LOW (ref 13.0–17.0)
MCH: 31.8 pg (ref 26.0–34.0)
MCHC: 34.6 g/dL (ref 30.0–36.0)
MCV: 92 fL (ref 78.0–100.0)
PLATELETS: 139 10*3/uL — AB (ref 150–400)
RBC: 3.74 MIL/uL — ABNORMAL LOW (ref 4.22–5.81)
RDW: 13.1 % (ref 11.5–15.5)
WBC: 10 10*3/uL (ref 4.0–10.5)

## 2013-08-28 MED ORDER — HALOPERIDOL LACTATE 5 MG/ML IJ SOLN
1.0000 mg | Freq: Four times a day (QID) | INTRAMUSCULAR | Status: DC | PRN
  Filled 2013-08-28: qty 1

## 2013-08-28 MED ORDER — TAMSULOSIN HCL 0.4 MG PO CAPS
0.4000 mg | ORAL_CAPSULE | Freq: Every day | ORAL | Status: DC
  Administered 2013-08-28 – 2013-08-30 (×3): 0.4 mg via ORAL
  Filled 2013-08-28 (×4): qty 1

## 2013-08-28 MED ORDER — PIPERACILLIN-TAZOBACTAM 3.375 G IVPB
3.3750 g | Freq: Three times a day (TID) | INTRAVENOUS | Status: DC
  Administered 2013-08-28 – 2013-08-29 (×5): 3.375 g via INTRAVENOUS
  Filled 2013-08-28 (×6): qty 50

## 2013-08-28 NOTE — Progress Notes (Signed)
Pt still has not voided since Foley Cath removed 2/26 @ 1400. Unable to void in urinal at bedside. Bladder Scan shows >200. Will call MD.

## 2013-08-28 NOTE — Evaluation (Signed)
Occupational Therapy Evaluation Patient Details Name: BREYER TEJERA MRN: [id number] DOB: 03-22-1928 Today's Date: 08/28/2013 Time: 1208-1228 OT Time Calculation (min): 20 min  OT Assessment / Plan / Recommendation History of present illness s/p L hip hemiarthroplasty after sustaining L femoral neck fx due to fall at [facility]   Clinical Impression   Pt was admitted for the above surgery. He will benefit from skilled OT to increase safety and independence with adls following thps.  Goals in acute are for +1 mod A overall.  Pt was mod I prior to admission    OT Assessment  Patient needs continued OT Services    Follow Up Recommendations  SNF    Barriers to Discharge      Equipment Recommendations  3 in 1 bedside comode    Recommendations for Other Services    Frequency  Min 2X/week    Precautions / Restrictions Precautions Precautions: Fall;Posterior Hip Precaution Comments: cues needed for internal rotation Restrictions Other Position/Activity Restrictions: WBAT   Pertinent Vitals/Pain C/o pain in L hip but not rated.  Repositioned and premedicated    ADL  Grooming: Set up;Supervision/safety Where Assessed - Grooming: Supported sitting Upper Body Bathing: Set up;Supervision/safety Where Assessed - Upper Body Bathing: Supported sitting Lower Body Bathing: +2 Total assistance Lower Body Bathing: Patient Percentage: 20% Where Assessed - Lower Body Bathing: Supported sit to stand Upper Body Dressing: Minimal assistance Where Assessed - Upper Body Dressing: Unsupported sitting Lower Body Dressing: +2 Total assistance Lower Body Dressing: Patient Percentage: 0% Where Assessed - Lower Body Dressing: Supported sit to stand Toilet Transfer: Simulated;+2 Total assistance;Moderate assistance Toilet Transfer Method: Stand pivot Toileting - Clothing Manipulation and Hygiene: +2 Total assistance Toileting - Clothing Manipulation and Hygiene: Patient Percentage: 40% Where Assessed -  Toileting Clothing Manipulation and Hygiene: Sit to stand from 3-in-1 or toilet Equipment Used: Rolling walker Transfers/Ambulation Related to ADLs: spt to recliner ADL Comments: educated on thps:  pt did not recall any.  He has a strong internal rotation:  reapplied KI once up in chair.  Educated on/demonstrated reacher and sock aid, but pt did not practice with these yet    OT Diagnosis: Generalized weakness;Acute pain  OT Problem List: Decreased activity tolerance;Decreased cognition;Decreased safety awareness;Decreased knowledge of use of DME or AE;Decreased knowledge of precautions;Pain OT Treatment Interventions: Self-care/ADL training;DME and/or AE instruction;Patient/family education;Cognitive remediation/compensation   OT Goals(Current goals can be found in the care plan section) Acute Rehab OT Goals Patient Stated Goal: none stated.  agreeable to ot OT Goal Formulation: With patient/family Time For Goal Achievement: 09/04/13 Potential to Achieve Goals: Good ADL Goals Pt Will Perform Lower Body Bathing: with mod assist;with adaptive equipment;sit to/from stand Pt Will Perform Lower Body Dressing: with mod assist;with adaptive equipment;sit to/from stand Pt Will Transfer to Toilet: with mod assist;bedside commode;stand pivot transfer Pt Will Perform Toileting - Clothing Manipulation and hygiene: with mod assist;sit to/from stand Additional ADL Goal #1: pt will recall 3/3 thps with min cues  Visit Information  Last OT Received On: 08/28/13 Assistance Needed: +2 History of Present Illness: s/p L hip hemiarthroplasty after sustaining L femoral neck fx due to fall at [facility]       Prior Functioning     Home Living Family/patient expects to be discharged to:: Skilled nursing facility Prior Function Level of Independence: Independent with assistive device(s) Communication Communication: No difficulties Dominant Hand: Right         Vision/Perception     Cognition   Cognition Arousal/Alertness: Awake/alert Behavior  During Therapy: WFL for tasks assessed/performed Overall Cognitive Status: Impaired/Different from baseline General Comments: pt with some confusion. Son present.  Needs extra time and multimodal cues for thps    Extremity/Trunk Assessment Upper Extremity Assessment Upper Extremity Assessment: Overall WFL for tasks assessed     Mobility Bed Mobility Bed Mobility: Sit to Supine Supine to sit: Mod assist;+2 for physical assistance General bed mobility comments: vcs for technique and assist for LLE/trunk Transfers Transfers: Sit to/from Stand Sit to Stand: Mod assist;+2 physical assistance General transfer comment: multimodal cues     Exercise     Balance     End of Session OT - End of Session Activity Tolerance: Patient tolerated treatment well Patient left: in chair;with call bell/phone within reach;with family/visitor present Nurse Communication: Mobility status  GO     Jadier Rockers 08/28/2013, 1:54 PM Marica OtterMaryellen Wilho Sharpley, OTR/L [phone] 08/28/2013

## 2013-08-28 NOTE — Progress Notes (Signed)
[facility] PROGRESS NOTE  Todd Whitaker YNW:[id number] DOB: 1927/09/30 DOA: 08/26/2013 PCP: Mickie Hillier, MD  Assessment/Plan   Subcapital fracture of the left hip s/p left hip arthroplasty on 2/25 by Dr. Luiz Blare -  DVT proph and weight bearing status per Ortho -  Pain control per ortho -  Appreciate orthopedic assistance -  PT/OT rec SNF -  SW to assist with SNF placement  Aspiration pneumonia with fever and cough -  Start zosyn -  Transition to augmentin closer to discharge -  Speech therapy consult -  MBS:  Concerning for aspiration of particulates and complicated by lordosis -  Dysphagia 3 with thin liquids and extra sauces  Hypertension, blood pressure trending down towards normal range today -Continue home dose of ACE inhibitor  Abnormal CT scan of the brain:  hypodensity in the left cerebellar hemisphere which is likely technical in nature, but cannot rule out an acute ischemic change.  -  Son describes TIA-like episode a few months ago that results in balance problems.   -  Repeat CT brain   Dementia, with sundowning -  Defer work up for reversible causes to PCP -  Lights on during day and off at night -  Frequent reorientation -  Haldol prn  Hx of SIADH and sodium stable at 133 -  Judicious use of IVF -  Repeat BMP in AM  Acute urinary retention likely side effect of narcotics -  Coude catheter -  Start flomax   Acute blood loss anemia   -  Continue iron supplementation -  Repeat CBC in AM  Mild thrombocytopenia, acute phase reactant and possibly some consumption due to recent surgery.  Stable.    Diet:  Regular  Access:  PIV IVF:  yes Proph:  ASA 325mg  BID + SCDs  Code Status: full Family Communication: spoke with patient and his son Disposition Plan: to SNF Monday   Consultants:  Dr. Luiz Blare, orthopedics  Procedures:  Left hip hemiarthroplasty 2/25  Antibiotics:  perioperative   HPI/Subjective:  Pain in his left leg, but  otherwise feeling well.  Denies confusion, speech difficulties, focal numbness or weakness except for limitation of left leg ROM 2/2 pain.  Per family, he was confused and agitated yesterday.  Unable to void after catheter was pulled out yesterday.  Coughing particularly with meals/solid foods  Objective: Filed Vitals:   08/27/13 2030 08/27/13 2100 08/28/13 0650 08/28/13 1332  BP: 142/72  146/74 124/65  Pulse: 90  81 87  Temp: 98.5 F (36.9 C) 100.5 F (38.1 C) 97.5 F (36.4 C) 98.5 F (36.9 C)  TempSrc: Oral Oral Oral Oral  Resp: 20  20 16   Height:      Weight:      SpO2: 90%  92% 92%    Intake/Output Summary (Last 24 hours) at 08/28/13 1835 Last data filed at 08/28/13 1736  Gross per 24 hour  Intake 810.83 ml  Output    800 ml  Net  10.83 ml   Filed Weights   08/26/13 1600  Weight: 62.596 kg (138 lb)    Exam:   General:  CM, No acute distress  HEENT:  NCAT, MMM  Cardiovascular:  RRR, nl S1, S2 no mrg, 2+ pulses, warm extremities  Respiratory:  rhonchorous BS and rales at the right base, no wheezes, no increased WOB  Abdomen:   NABS, soft, NT/ND  MSK:   Normal tone and bulk, no LEE, decrease ROM left leg.  Knee braced.  Bandage  left lateral hip c/d/i.  Minimal swelling and bruising.  Neuro:  Grossly intact  Data Reviewed: Basic Metabolic Panel:  Recent Labs Lab 08/26/13 1100 08/27/13 0350 08/28/13 0658  NA 136* 133* 133*  K 4.0 4.1 3.8  CL 99 99 97  CO2 22 22 23   GLUCOSE 127* 159* 128*  BUN 21 14 22   CREATININE 0.71 0.73 0.77  CALCIUM 8.7 8.0* 8.2*   Liver Function Tests: No results found for this basename: AST, ALT, ALKPHOS, BILITOT, PROT, ALBUMIN,  in the last 168 hours No results found for this basename: LIPASE, AMYLASE,  in the last 168 hours No results found for this basename: AMMONIA,  in the last 168 hours CBC:  Recent Labs Lab 08/26/13 1100 08/27/13 0350 08/28/13 0658  WBC 6.9 9.5 10.0  NEUTROABS 5.9  --   --   HGB 13.4 12.2*  11.9*  HCT 40.1 36.5* 34.4*  MCV 94.1 94.3 92.0  PLT 149* 139* 139*   Cardiac Enzymes: No results found for this basename: CKTOTAL, CKMB, CKMBINDEX, TROPONINI,  in the last 168 hours BNP (last 3 results) No results found for this basename: PROBNP,  in the last 8760 hours CBG: No results found for this basename: GLUCAP,  in the last 168 hours  Recent Results (from the past 240 hour(s))  SURGICAL PCR SCREEN     Status: Abnormal   Collection Time    08/26/13  4:15 PM      Result Value Ref Range Status   MRSA, PCR INVALID RESULTS, SPECIMEN SENT FOR CULTURE (*) NEGATIVE Final   Comment: Chinita Pester RN [phone] 08/26/13 A NAVARRO   Staphylococcus aureus INVALID RESULTS, SPECIMEN SENT FOR CULTURE (*) NEGATIVE Final   Comment:            The Xpert SA Assay (FDA     approved for NASAL specimens     in patients over 14 years of age),     is one component of     a comprehensive surveillance     program.  Test performance has     been validated by [employer] for patients greater     than or equal to 80 year old.     It is not intended     to diagnose infection nor to     guide or monitor treatment.  MRSA CULTURE     Status: None   Collection Time    08/26/13  4:15 PM      Result Value Ref Range Status   Specimen Description NOSE   Final   Special Requests NONE   Final   Culture     Final   Value: NO SUSPICIOUS COLONIES, CONTINUING TO HOLD     Performed at [facility]   Report Status PENDING   Incomplete  SURGICAL PCR SCREEN     Status: Abnormal   Collection Time    08/27/13  3:06 AM      Result Value Ref Range Status   MRSA, PCR INVALID RESULTS, SPECIMEN SENT FOR CULTURE (*) NEGATIVE Final   Comment: INFORMED D. HOLCOMB RN AT [id number] ON 02.26.15 BY SHUEA   Staphylococcus aureus INVALID RESULTS, SPECIMEN SENT FOR CULTURE (*) NEGATIVE Final   Comment: INFORMED D. HOLCOMB RN AT [id number] ON 02.26.15 BY SHUEA                The Xpert SA Assay (FDA     approved for NASAL  specimens  in patients over 78 years of age),     is one component of     a comprehensive surveillance     program.  Test performance has     been validated by [facility] for patients greater     than or equal to 78 year old.     It is not intended     to diagnose infection nor to     guide or monitor treatment.  MRSA CULTURE     Status: None   Collection Time    08/27/13  3:15 AM      Result Value Ref Range Status   Specimen Description NOSE   Final   Special Requests NONE   Final   Culture     Final   Value: NO SUSPICIOUS COLONIES, CONTINUING TO HOLD     Performed at [facility]   Report Status PENDING   Incomplete     Studies: Ct Head Wo Contrast  08/27/2013   CLINICAL DATA:  Followup abnormal head CT.  EXAM: CT HEAD WITHOUT CONTRAST  TECHNIQUE: Contiguous axial images were obtained from the base of the skull through the vertex without intravenous contrast.  COMPARISON:  08/26/2013 and 01/29/2011  FINDINGS: Motion again degree hazy images. This particularly limits evaluation of the inferior posterior fossa. The reported subtle asymmetric hypoattenuation in the left cerebellar hemispheres is not demonstrated on the current exam, but this area is poorly visualized.  The ventricles are normal in configuration. There is ventricular and sulcal enlargement reflecting moderate atrophy. No hydrocephalus.  No parenchymal masses or mass effect. Patchy areas of white matter hypoattenuation are noted most consistent with mild chronic microvascular ischemic change.  There is no evidence of a recent cortical infarct.  There are no extra-axial masses or abnormal fluid collections.  There is no intracranial hemorrhage.  Visualized sinuses and mastoid air cells are clear.  IMPRESSION: 1. Study is limited by motion, which particularly affects evaluation of the posterior fossa. The questionable asymmetric hypoattenuation in the left cerebellar hemisphere noted on the previous exam is not  well evaluated currently. 2. Allowing for the motion limitation, there is no evidence of acute intracranial pathology. 3. Moderate atrophy and mild chronic microvascular ischemic change.   Electronically Signed   By: Amie Portlandavid  Ormond M.D.   On: 08/27/2013 15:35   Dg Pelvis Portable  08/26/2013   CLINICAL DATA:  Postop left hip surgery.  EXAM: PORTABLE PELVIS 1-2 VIEWS  COMPARISON:  None.  FINDINGS: New left hip prosthesis is well-seated and aligned. No evidence of an operative complication.   Electronically Signed   By: Amie Portlandavid  Ormond M.D.   On: 08/26/2013 21:32   Dg Chest Port 1 View  08/27/2013   CLINICAL DATA:  New onset of fever.  Recent hip surgery.  EXAM: PORTABLE CHEST - 1 VIEW  COMPARISON:  08/26/2013.  FINDINGS: New right base opacity silhouettes the elevated right hemidiaphragm, concerning for pneumonia. No pneumothorax. No significant volume loss. Stable cardiomegaly and vascular calcification. Osteopenia.  IMPRESSION: New right base infiltrate, suspicious for pneumonia.   Electronically Signed   By: Davonna BellingJohn  Curnes M.D.   On: 08/27/2013 19:55   Dg Hip Portable 1 View Left  08/26/2013   CLINICAL DATA:  Postop left hip arthroplasty.  EXAM: PORTABLE LEFT HIP - 1 VIEW  COMPARISON:  None.  FINDINGS: New left hip prosthesis is well-seated and aligned There is no acute fracture or evidence of an operative complication.  Electronically Signed   By: Amie Portland M.D.   On: 08/26/2013 21:33   Dg Swallowing Func-speech Pathology  08/28/2013   Chales Abrahams, CCC-SLP     08/28/2013  4:14 PM    Objective Swallowing Evaluation: Modified Barium Swallowing  Study  Patient Details  Name: Todd Whitaker MRN: [id number] Date of Birth: [birth year]  Today's Date: 08/28/2013 Time: 1324-4010 SLP Time Calculation (min): 28 min  Past Medical History:  Past Medical History  Diagnosis Date  . Hypertension   . Glaucoma   . Osteoporosis   . Hyperlipidemia   . SIADH (syndrome of inappropriate ADH production)    Past Surgical  History:  Past Surgical History  Procedure Laterality Date  . Cataract extraction, bilateral    . Transurethral resection of prostate    . Hip arthroplasty Left 08/26/2013    Procedure: ARTHROPLASTY BIPOLAR HIP;  Surgeon: Harvie Junior,  MD;  Location: [facility];  Service: Orthopedics;  Laterality: Left;   HPI:  80 yo male adm to [facility] with left hip fx, s/p left orthoplasty,  post op fever with CXR indicating right lower lobe pna.  CT head  showed ? left cerebellar hypoattentuation.  Pt has h/o dementia  *son reports progressive decline in the last few months and HTN.   Son stated he eats with pt on weekends and he has not noticed pt  having dysphagia prior to this admit.       Assessment / Plan / Recommendation Clinical Impression  Dysphagia Diagnosis: Moderate oral phase dysphagia;Mild cervical  esophageal phase dysphagia;Moderate pharyngeal phase dysphagia Clinical impression: Moderate oropharyngeal and mild cervical  esophageal dysphagia present with sensorimotor deficits.  Pt also  appears with significant lordosis (radiologist not present to  confirm) that prevents adequately epiglottic deflection allowing  significant stasis at vallecular space more than pyriform sinus  region and likely decreased UES clearance as well.  Mild amount  of aspiration with cough response noted with thin when attempting  head turn, right/left and chin tuck.   Neutral position appeared  most effective in swallowing without overt aspiration noted.   Oral propulsion impaired evident by oral tongue base residuals  without pt sensation.  Following solids with liquds helpful to  decrease oropharyngeal stasis.  Trace penetration of thin cleared  with cued throat clear/cough.  Please note, pt was conducting  throat clear during MBS without barium always visualized in  larynx/trachea.  Recommend soft diet/ground meats with thin  liquids with strict precautions to mitigate aspiration risk.  Recommend avoiding particulate foods due to concerns for   aspiration of small particles.  Suspect component of chronic  deficits given appearance of lordosis for which pt has managed  prior to this event.  Son Todd Whitaker present and son/pt educated to  findings and recommendations using monitor/verbal/diagram for  comprehension.    Recommend follow up SLP to maximize pt's  swallow rehabiliation with goal to return to premorbid level.      Treatment Recommendation  Therapy as outlined in treatment plan below    Diet Recommendation Dysphagia 3 (Mechanical Soft);Thin liquid  (ground meat, extra gravy/sauces)   Liquid Administration via: Cup;Straw Medication Administration: Crushed with puree Supervision: Patient able to self feed Compensations: Slow rate;Small sips/bites;Multiple dry swallows  after each bite/sip;Follow solids with liquid (start meal with  liquids, prefer water) Postural Changes and/or Swallow Maneuvers: Seated upright 90  degrees;Upright 30-60 min after meal    Other  Recommendations Recommended Consults: MBS Oral Care Recommendations: Oral care  before and after PO   Follow Up Recommendations  Skilled Nursing facility    Frequency and Duration min 2x/week  2 weeks   Pertinent Vitals/Pain Today afebrile, decreased   SLP Swallow Goals     General Date of Onset: 08/28/13 HPI: 80 yo male adm to [facility] with left hip fx, s/p left  orthoplasty, post op fever with CXR indicating right lower lobe  pna.  CT head showed ? left cerebellar hypoattentuation.  Pt has  h/o dementia *son reports progressive decline in the last few  months and HTN.  Son stated he eats with pt on weekends and he  has not noticed pt having dysphagia prior to this admit.   Type of Study: Modified Barium Swallowing Study Reason for Referral: Objectively evaluate swallowing function Diet Prior to this Study: Dysphagia 2 (chopped);Thin liquids Temperature Spikes Noted:  (yesterday febrile, today afebrile) Respiratory Status: Room air History of Recent Intubation: Yes Length of Intubations (days):  (for  surgery only) Behavior/Cognition: Alert;Cooperative;Decreased sustained  attention;Doesn't follow directions Oral Cavity - Dentition: Adequate natural dentition Oral Motor / Sensory Function: Within functional limits Self-Feeding Abilities: Able to feed self Patient Positioning: Upright in chair Baseline Vocal Quality: Low vocal intensity (improved today  compared to yesterday) Volitional Cough: Strong Volitional Swallow: Able to elicit Anatomy:  (pt with appearance of significant lordosis of cervical  spine, radiologist not present to confirm) Pharyngeal Secretions: Standing secretions in (comment) (pyriform  sinus)    Reason for Referral Objectively evaluate swallowing function   Oral Phase Oral Preparation/Oral Phase Oral Phase: Impaired Oral - Nectar Oral - Nectar Teaspoon: Weak lingual manipulation;Reduced  posterior propulsion Oral - Nectar Cup: Reduced posterior propulsion;Weak lingual  manipulation Oral - Thin Oral - Thin Teaspoon: Reduced posterior propulsion;Weak lingual  manipulation Oral - Thin Cup: Reduced posterior propulsion;Weak lingual  manipulation Oral - Thin Straw: Weak lingual manipulation;Reduced posterior  propulsion Oral - Solids Oral - Puree: Reduced posterior propulsion;Weak lingual  manipulation;Lingual/palatal residue Oral - Regular: Reduced posterior propulsion;Weak lingual  manipulation;Lingual/palatal residue Oral Phase - Comment Oral Phase - Comment: base of tongue stasis noted with thicker  consistencies more than thin without pt awareness   Pharyngeal Phase Pharyngeal Phase Pharyngeal Phase: Impaired Pharyngeal - Nectar Pharyngeal - Nectar Teaspoon: Reduced epiglottic  inversion;Reduced tongue base retraction;Pharyngeal residue -  valleculae;Pharyngeal residue - pyriform sinuses Pharyngeal - Nectar Cup: Reduced epiglottic inversion;Reduced  tongue base retraction;Pharyngeal residue - valleculae;Pharyngeal  residue - pyriform sinuses Pharyngeal - Thin Pharyngeal - Thin Teaspoon:  Pharyngeal residue - pyriform  sinuses;Pharyngeal residue - valleculae;Reduced epiglottic  inversion;Reduced tongue base retraction Pharyngeal - Thin Cup: Reduced epiglottic inversion;Reduced  tongue base retraction;Pharyngeal residue - valleculae;Pharyngeal  residue - pyriform sinuses;Moderate aspiration Pharyngeal - Thin Straw: Reduced epiglottic inversion;Reduced  tongue base retraction;Moderate aspiration Pharyngeal - Solids Pharyngeal - Puree: Reduced pharyngeal peristalsis;Reduced  epiglottic inversion;Pharyngeal residue - valleculae Pharyngeal - Regular: Reduced epiglottic inversion;Reduced tongue  base retraction;Pharyngeal residue - valleculae Pharyngeal Phase - Comment Pharyngeal Comment: attempts at head turn right or left and chin  tuck were not effective to prevent laryngeal penetration, head  neutral best position for pt, pt does not sense pharyngeal stasis  and did not "hock" adequately to clear, following solids with  liquids and conducting mulitple swallows effective to decrease  stasis, intermittent throat clear/cough cleared trace  penetrates/aspirates  Cervical Esophageal Phase    GO    Cervical Esophageal Phase Cervical Esophageal Phase: Impaired Cervical Esophageal Phase - Nectar Nectar Teaspoon: Reduced cricopharyngeal relaxation Nectar Cup:  Reduced cricopharyngeal relaxation Cervical Esophageal Phase - Thin Thin Teaspoon: Reduced cricopharyngeal relaxation Thin Cup: Reduced cricopharyngeal relaxation Thin Straw: Reduced cricopharyngeal relaxation Cervical Esophageal Phase - Solids Puree: Reduced cricopharyngeal relaxation Regular: Reduced cricopharyngeal relaxation Cervical Esophageal Phase - Comment Cervical Esophageal Comment: appearance of secretions mixed with  barium in pyriform sinus noted, suspect lordosis impacting  pharyngeal clearance into esophagus         Donavan Burnet, MS [facility] SLP [phone]     Scheduled Meds: . aspirin EC  325 mg Oral BID PC  . docusate sodium  100 mg Oral  BID  . ferrous sulfate  325 mg Oral Q breakfast  . pilocarpine  1 drop Both Eyes QID  . piperacillin-tazobactam (ZOSYN)  IV  3.375 g Intravenous 3 times per day  . ramipril  10 mg Oral Daily  . senna  2 tablet Oral QHS  . timolol  1 drop Both Eyes Daily   Continuous Infusions: . sodium chloride 75 mL/hr at 08/28/13 1527    Principal Problem:   Subcapital fracture of left hip Active Problems:   HTN (hypertension)   Abnormal CT of brain   Closed left hip fracture   Acute blood loss anemia   Thrombocytopenia, unspecified   Hyponatremia   Postoperative fever    Time spent: 30 min    Vanessa Alesi, [facility]  [facility] Pager [phone]. If 7PM-7AM, please contact night-coverage at [email or web address], password [facility] 08/28/2013, 6:35 PM  LOS: 2 days

## 2013-08-28 NOTE — Progress Notes (Signed)
Subjective: 2 Days Post-Op Procedure(s) (LRB): ARTHROPLASTY BIPOLAR HIP (Left) Patient reports pain as 2 on 0-10 scale.   Some confusion. Son at bedside  Objective: Vital signs in last 24 hours: Temp:  [97.5 F (36.4 C)-100.5 F (38.1 C)] 98.5 F (36.9 C) (02/27 1332) Pulse Rate:  [81-90] 87 (02/27 1332) Resp:  [16-20] 16 (02/27 1332) BP: (124-146)/(65-74) 124/65 mmHg (02/27 1332) SpO2:  [90 %-92 %] 92 % (02/27 1332)  Intake/Output from previous day: 02/26 0701 - 02/27 0700 In: 985 [P.O.:540; I.V.:445] Out: 0  Intake/Output this shift: Total I/O In: 630.8 [P.O.:540; I.V.:90.8] Out: 800 [Urine:800]   Recent Labs  08/26/13 1100 08/27/13 0350 08/28/13 0658  HGB 13.4 12.2* 11.9*    Recent Labs  08/27/13 0350 08/28/13 0658  WBC 9.5 10.0  RBC 3.87* 3.74*  HCT 36.5* 34.4*  PLT 139* 139*    Recent Labs  08/27/13 0350 08/28/13 0658  NA 133* 133*  K 4.1 3.8  CL 99 97  CO2 22 23  BUN 14 22  CREATININE 0.73 0.77  GLUCOSE 159* 128*  CALCIUM 8.0* 8.2*    Recent Labs  08/26/13 1100  INR 1.01  pt confused and pulling at his clothing/gown Left hip: Neurovascular intact Sensation intact distally Intact pulses distally Dorsiflexion/Plantar flexion intact Incision: dressing C/D/I Compartment soft  Assessment/Plan: 2 Days Post-Op Procedure(s) (LRB): ARTHROPLASTY BIPOLAR HIP (Left) Up with therapy Discharge to SNF Monday Cont daily PT Cont ASA 325mg  BID/scds for vte prophylaxis Medical care per hospitalists Kitana Gage G 08/28/2013, 6:21 PM

## 2013-08-28 NOTE — Procedures (Signed)
Objective Swallowing Evaluation: Modified Barium Swallowing Study  Patient Details  Name: Todd Whitaker MRN: [id number] Date of Birth: 14-Apr-1928  Today's Date: 08/28/2013 Time: 4696-2952 SLP Time Calculation (min): 28 min  Past Medical History:  Past Medical History  Diagnosis Date  . Hypertension   . Glaucoma   . Osteoporosis   . Hyperlipidemia   . SIADH (syndrome of inappropriate ADH production)    Past Surgical History:  Past Surgical History  Procedure Laterality Date  . Cataract extraction, bilateral    . Transurethral resection of prostate    . Hip arthroplasty Left 08/26/2013    Procedure: ARTHROPLASTY BIPOLAR HIP;  Surgeon: Harvie Junior, MD;  Location: [facility];  Service: Orthopedics;  Laterality: Left;   HPI:  80 yo male adm to [facility] with left hip fx, s/p left orthoplasty, post op fever with CXR indicating right lower lobe pna.  CT head showed ? left cerebellar hypoattentuation.  Pt has h/o dementia *son reports progressive decline in the last few months and HTN.  Son stated he eats with pt on weekends and he has not noticed pt having dysphagia prior to this admit.       Assessment / Plan / Recommendation Clinical Impression  Dysphagia Diagnosis: Moderate oral phase dysphagia;Mild cervical esophageal phase dysphagia;Moderate pharyngeal phase dysphagia Clinical impression: Moderate oropharyngeal and mild cervical esophageal dysphagia present with sensorimotor deficits.  Pt also appears with significant lordosis (radiologist not present to confirm) that prevents adequately epiglottic deflection allowing significant stasis at vallecular space more than pyriform sinus region and likely decreased UES clearance as well.  Mild amount of aspiration with cough response noted with thin when attempting head turn, right/left and chin tuck.   Neutral position appeared most effective in swallowing without overt aspiration noted.  Oral propulsion impaired evident by oral tongue base  residuals without pt sensation.  Following solids with liquds helpful to decrease oropharyngeal stasis.  Trace penetration of thin cleared with cued throat clear/cough.  Please note, pt was conducting throat clear during MBS without barium always visualized in larynx/trachea.  Recommend soft diet/ground meats with thin liquids with strict precautions to mitigate aspiration risk. Recommend avoiding particulate foods due to concerns for aspiration of small particles.  Suspect component of chronic deficits given appearance of lordosis for which pt has managed prior to this event.  Son Viviann Spare present and son/pt educated to findings and recommendations using monitor/verbal/diagram for comprehension.    Recommend follow up SLP to maximize pt's swallow rehabiliation with goal to return to premorbid level.      Treatment Recommendation  Therapy as outlined in treatment plan below    Diet Recommendation Dysphagia 3 (Mechanical Soft);Thin liquid (ground meat, extra gravy/sauces)   Liquid Administration via: Cup;Straw Medication Administration: Crushed with puree Supervision: Patient able to self feed Compensations: Slow rate;Small sips/bites;Multiple dry swallows after each bite/sip;Follow solids with liquid (start meal with liquids, prefer water) Postural Changes and/or Swallow Maneuvers: Seated upright 90 degrees;Upright 30-60 min after meal    Other  Recommendations Recommended Consults: MBS Oral Care Recommendations: Oral care before and after PO   Follow Up Recommendations  Skilled Nursing facility    Frequency and Duration min 2x/week  2 weeks   Pertinent Vitals/Pain Today afebrile, decreased   SLP Swallow Goals     General Date of Onset: 08/28/13 HPI: 80 yo male adm to [facility] with left hip fx, s/p left orthoplasty, post op fever with CXR indicating right lower lobe pna.  CT head showed ?  left cerebellar hypoattentuation.  Pt has h/o dementia *son reports progressive decline in the last few  months and HTN.  Son stated he eats with pt on weekends and he has not noticed pt having dysphagia prior to this admit.   Type of Study: Modified Barium Swallowing Study Reason for Referral: Objectively evaluate swallowing function Diet Prior to this Study: Dysphagia 2 (chopped);Thin liquids Temperature Spikes Noted:  (yesterday febrile, today afebrile) Respiratory Status: Room air History of Recent Intubation: Yes Length of Intubations (days):  (for surgery only) Behavior/Cognition: Alert;Cooperative;Decreased sustained attention;Doesn't follow directions Oral Cavity - Dentition: Adequate natural dentition Oral Motor / Sensory Function: Within functional limits Self-Feeding Abilities: Able to feed self Patient Positioning: Upright in chair Baseline Vocal Quality: Low vocal intensity (improved today compared to yesterday) Volitional Cough: Strong Volitional Swallow: Able to elicit Anatomy:  (pt with appearance of significant lordosis of cervical spine, radiologist not present to confirm) Pharyngeal Secretions: Standing secretions in (comment) (pyriform sinus)    Reason for Referral Objectively evaluate swallowing function   Oral Phase Oral Preparation/Oral Phase Oral Phase: Impaired Oral - Nectar Oral - Nectar Teaspoon: Weak lingual manipulation;Reduced posterior propulsion Oral - Nectar Cup: Reduced posterior propulsion;Weak lingual manipulation Oral - Thin Oral - Thin Teaspoon: Reduced posterior propulsion;Weak lingual manipulation Oral - Thin Cup: Reduced posterior propulsion;Weak lingual manipulation Oral - Thin Straw: Weak lingual manipulation;Reduced posterior propulsion Oral - Solids Oral - Puree: Reduced posterior propulsion;Weak lingual manipulation;Lingual/palatal residue Oral - Regular: Reduced posterior propulsion;Weak lingual manipulation;Lingual/palatal residue Oral Phase - Comment Oral Phase - Comment: base of tongue stasis noted with thicker consistencies more than  thin without pt awareness   Pharyngeal Phase Pharyngeal Phase Pharyngeal Phase: Impaired Pharyngeal - Nectar Pharyngeal - Nectar Teaspoon: Reduced epiglottic inversion;Reduced tongue base retraction;Pharyngeal residue - valleculae;Pharyngeal residue - pyriform sinuses Pharyngeal - Nectar Cup: Reduced epiglottic inversion;Reduced tongue base retraction;Pharyngeal residue - valleculae;Pharyngeal residue - pyriform sinuses Pharyngeal - Thin Pharyngeal - Thin Teaspoon: Pharyngeal residue - pyriform sinuses;Pharyngeal residue - valleculae;Reduced epiglottic inversion;Reduced tongue base retraction Pharyngeal - Thin Cup: Reduced epiglottic inversion;Reduced tongue base retraction;Pharyngeal residue - valleculae;Pharyngeal residue - pyriform sinuses;Moderate aspiration Pharyngeal - Thin Straw: Reduced epiglottic inversion;Reduced tongue base retraction;Moderate aspiration Pharyngeal - Solids Pharyngeal - Puree: Reduced pharyngeal peristalsis;Reduced epiglottic inversion;Pharyngeal residue - valleculae Pharyngeal - Regular: Reduced epiglottic inversion;Reduced tongue base retraction;Pharyngeal residue - valleculae Pharyngeal Phase - Comment Pharyngeal Comment: attempts at head turn right or left and chin tuck were not effective to prevent laryngeal penetration, head neutral best position for pt, pt does not sense pharyngeal stasis and did not "hock" adequately to clear, following solids with liquids and conducting mulitple swallows effective to decrease stasis, intermittent throat clear/cough cleared trace penetrates/aspirates  Cervical Esophageal Phase    GO    Cervical Esophageal Phase Cervical Esophageal Phase: Impaired Cervical Esophageal Phase - Nectar Nectar Teaspoon: Reduced cricopharyngeal relaxation Nectar Cup: Reduced cricopharyngeal relaxation Cervical Esophageal Phase - Thin Thin Teaspoon: Reduced cricopharyngeal relaxation Thin Cup: Reduced cricopharyngeal relaxation Thin Straw:  Reduced cricopharyngeal relaxation Cervical Esophageal Phase - Solids Puree: Reduced cricopharyngeal relaxation Regular: Reduced cricopharyngeal relaxation Cervical Esophageal Phase - Comment Cervical Esophageal Comment: appearance of secretions mixed with barium in pyriform sinus noted, suspect lordosis impacting pharyngeal clearance into esophagus         Donavan Burnetamara Dorcus Riga, MS [facility] SLP [phone]

## 2013-08-28 NOTE — Progress Notes (Signed)
CSW assisting with d/c planning. [facility] for [facility]. [employer] will be contacted on Monday to update CM . Pt / family have been updated.  Cori RazorJamie Bryley Kovacevic LCSW [phone]

## 2013-08-28 NOTE — Evaluation (Signed)
Clinical/Bedside Swallow Evaluation Patient Details  Name: Todd Whitaker MRN: [id number] Date of Birth: [birth year]  Today's Date: 08/28/2013 Time: 1240-1310 SLP Time Calculation (min): 30 min  Past Medical History:  Past Medical History  Diagnosis Date  . Hypertension   . Glaucoma   . Osteoporosis   . Hyperlipidemia   . SIADH (syndrome of inappropriate ADH production)    Past Surgical History:  Past Surgical History  Procedure Laterality Date  . Cataract extraction, bilateral    . Transurethral resection of prostate    . Hip arthroplasty Left 08/26/2013    Procedure: ARTHROPLASTY BIPOLAR HIP;  Surgeon: Harvie Junior, MD;  Location: [facility];  Service: Orthopedics;  Laterality: Left;   HPI:  79 yo male adm to [facility] with left hip fx, s/p left orthoplasty, post op fever with CXR indicating right lower lobe pna.  CT head showed ? left cerebellar hypoattentuation.  Pt has h/o dementia *son reports progressive decline in the last few months and HTN.  Son stated he eats with pt on weekends and he has not noticed pt having dysphagia prior to this admit.     Assessment / Plan / Recommendation Clinical Impression  Pt presents with clinical indications of compromised airway protection characterized by overt throat clearing with thin liquids more than other consistencies.  Delayed oral transiting mastication of solids noted with oral stasis and decreased abiliity to clear.  Chin tuck posture attempted with liquids but did not eliminate dysphagia/aspiration symptoms.  Pt is improved today compared to yesterday per son, however given ct head findings and cxr findings, can not rule out aspiration.  Recommend to consider MBS to allow instrumental swallow evaluation vs continue po with ongoing monitoring of dysphagia.  Md paged and order for MBS received.      Aspiration Risk  Moderate    Diet Recommendation Dysphagia 2 (Fine chop);Thin liquid   Liquid Administration via: Cup Medication  Administration: Whole meds with puree Supervision: Patient able to self feed Compensations: Slow rate;Small sips/bites;Check for pocketing Postural Changes and/or Swallow Maneuvers: Seated upright 90 degrees;Upright 30-60 min after meal    Other  Recommendations Recommended Consults: MBS Oral Care Recommendations: Oral care before and after PO   Follow Up Recommendations  Other (comment) (tbd)    Frequency and Duration min 1 x/week  1 week   Pertinent Vitals/Pain Afebrile, decreased    SLP Swallow Goals     Swallow Study Prior Functional Status   no h/o dysphagia    General Date of Onset: 08/28/13 HPI: 80 yo male adm to [facility] with left hip fx, s/p left orthoplasty, post op fever with CXR indicating right lower lobe pna.  CT head showed ? left cerebellar hypoattentuation.  Pt has h/o dementia *son reports progressive decline in the last few months and HTN.  Son stated he eats with pt on weekends and he has not noticed pt having dysphagia prior to this admit.   Type of Study: Bedside swallow evaluation Diet Prior to this Study: Dysphagia 2 (chopped);Thin liquids Temperature Spikes Noted: Yes Respiratory Status: Room air History of Recent Intubation: Yes Length of Intubations (days):  (short term for surgery) Behavior/Cognition: Alert;Cooperative;Pleasant mood;Hard of hearing;Decreased sustained attention Oral Cavity - Dentition: Adequate natural dentition Self-Feeding Abilities: Able to feed self Patient Positioning: Upright in chair Baseline Vocal Quality: Low vocal intensity (improved today compared to yesterday per son) Volitional Cough: Strong Volitional Swallow: Able to elicit    Oral/Motor/Sensory Function Overall Oral Motor/Sensory Function: Appears within functional  limits for tasks assessed (no focal cn deficits apparent)   Ice Chips Ice chips: Not tested   Thin Liquid Thin Liquid: Impaired Presentation: Self Fed;Cup;Straw Oral Phase Impairments: Reduced lingual  movement/coordination Pharyngeal  Phase Impairments: Throat Clearing - Immediate;Throat Clearing - Delayed Other Comments: chin tuck did not prevent subtle clinical indications of aspiration    Nectar Thick Nectar Thick Liquid: Not tested   Honey Thick Honey Thick Liquid: Not tested   Puree Puree: Within functional limits Presentation: Self Fed;Spoon   Solid   GO    Solid: Impaired Presentation: Self Fed Oral Phase Impairments: Reduced lingual movement/coordination;Impaired anterior to posterior transit Oral Phase Functional Implications: Oral residue Pharyngeal Phase Impairments: Throat Clearing - Immediate;Throat Clearing - Delayed Other Comments: chopped fish- excessive mastication with decreased oral clearance, son reports this to be new but improved compared to yesterday       Donavan Burnetamara Knox Cervi, MS [facility] SLP [phone]

## 2013-08-28 NOTE — Progress Notes (Signed)
ANTIBIOTIC CONSULT NOTE - INITIAL  Pharmacy Consult for Zosyn Indication:  Aspiration pneumonia  No Known Allergies  Patient Measurements: Height: 5\' 10"  (177.8 cm) Weight: 138 lb (62.596 kg) IBW/kg (Calculated) : 73   Vital Signs: Temp: 97.5 F (36.4 C) (02/27 0650) Temp src: Oral (02/27 0650) BP: 146/74 mmHg (02/27 0650) Pulse Rate: 81 (02/27 0650) Intake/Output from previous day: 02/26 0701 - 02/27 0700 In: 985 [P.O.:540; I.V.:445] Out: 0  Intake/Output from this shift:    Labs:  Recent Labs  08/26/13 1100 08/27/13 0350  WBC 6.9 9.5  HGB 13.4 12.2*  PLT 149* 139*  CREATININE 0.71 0.73   Estimated Creatinine Clearance: 61.9 ml/min (by C-G formula based on Cr of 0.73).    Microbiology: Recent Results (from the past 720 hour(s))  SURGICAL PCR SCREEN     Status: Abnormal   Collection Time    08/26/13  4:15 PM      Result Value Ref Range Status   MRSA, PCR INVALID RESULTS, SPECIMEN SENT FOR CULTURE (*) NEGATIVE Final   Comment: Chinita PesterCALLED A FARGO RN [phone] 08/26/13 A [facility]   Staphylococcus aureus INVALID RESULTS, SPECIMEN SENT FOR CULTURE (*) NEGATIVE Final   Comment:            The Xpert SA Assay (FDA     approved for NASAL specimens     in patients over 78 years of age),     is one component of     a comprehensive surveillance     program.  Test performance has     been validated by [facility] for patients greater     than or equal to 78 year old.     It is not intended     to diagnose infection nor to     guide or monitor treatment.  SURGICAL PCR SCREEN     Status: Abnormal   Collection Time    08/27/13  3:06 AM      Result Value Ref Range Status   MRSA, PCR INVALID RESULTS, SPECIMEN SENT FOR CULTURE (*) NEGATIVE Final   Comment: INFORMED D. HOLCOMB RN AT [id number] ON 02.26.15 BY SHUEA   Staphylococcus aureus INVALID RESULTS, SPECIMEN SENT FOR CULTURE (*) NEGATIVE Final   Comment: INFORMED D. HOLCOMB RN AT [id number] ON 02.26.15 BY SHUEA                The  Xpert SA Assay (FDA     approved for NASAL specimens     in patients over 78 years of age),     is one component of     a comprehensive surveillance     program.  Test performance has     been validated by [facility] for patients greater     than or equal to 78 year old.     It is not intended     to diagnose infection nor to     guide or monitor treatment.    Medical History: Past Medical History  Diagnosis Date  . Hypertension   . Glaucoma   . Osteoporosis   . Hyperlipidemia   . SIADH (syndrome of inappropriate ADH production)     Medications:  Scheduled:  . aspirin EC  325 mg Oral BID PC  . docusate sodium  100 mg Oral BID  . ferrous sulfate  325 mg Oral Q breakfast  . pilocarpine  1 drop Both Eyes QID  . ramipril  10 mg Oral Daily  . senna  2 tablet Oral QHS  . timolol  1 drop Both Eyes Daily   Infusions:  . sodium chloride Stopped (08/27/13 1525)   PRN: acetaminophen, acetaminophen, alum & mag hydroxide-simeth, bisacodyl, HYDROcodone-acetaminophen, menthol-cetylpyridinium, methocarbamol (ROBAXIN) IV, methocarbamol, morphine injection, ondansetron (ZOFRAN) IV, ondansetron, phenol, polyethylene glycol  Assessment: 80 y/o M s/p L hip hemiarthroplasty 2/25 to repair femoral neck hip fracture, developed fever on the evening of 2/26; subsequent CXR demonstrated new R base infiltrate, suspicious for pneumonia.  Orders received 2/27 AM to begin Zosyn with pharmacy dosing assistance.  Goal of Therapy:  Appropriate antibiotic dosing; eradication of infection  Plan:  1. Zosyn 3.375 grams IV q8h, each dose infused over 4 hours (extended-infusion regimen). 2. Follow serum creatinine, culture data, clinical course.  Elie Goody, PharmD, BCPS Pager: [phone] 08/28/2013  7:26 AM

## 2013-08-29 DIAGNOSIS — D62 Acute posthemorrhagic anemia: Secondary | ICD-10-CM

## 2013-08-29 LAB — MRSA CULTURE

## 2013-08-29 LAB — BASIC METABOLIC PANEL
BUN: 14 mg/dL (ref 6–23)
CO2: 23 meq/L (ref 19–32)
Calcium: 7.7 mg/dL — ABNORMAL LOW (ref 8.4–10.5)
Chloride: 99 mEq/L (ref 96–112)
Creatinine, Ser: 0.68 mg/dL (ref 0.50–1.35)
GFR calc non Af Amer: 86 mL/min — ABNORMAL LOW (ref >90)
Glucose, Bld: 124 mg/dL — ABNORMAL HIGH (ref 70–99)
POTASSIUM: 4.1 meq/L (ref 3.7–5.3)
Sodium: 131 mEq/L — ABNORMAL LOW (ref 137–147)

## 2013-08-29 MED ORDER — AMOXICILLIN-POT CLAVULANATE 875-125 MG PO TABS
1.0000 | ORAL_TABLET | Freq: Two times a day (BID) | ORAL | Status: DC
  Administered 2013-08-29 – 2013-08-31 (×4): 1 via ORAL
  Filled 2013-08-29 (×5): qty 1

## 2013-08-29 NOTE — Progress Notes (Addendum)
Physical Therapy Treatment Patient Details Name: Todd Whitaker MRN: [id number] DOB: 12-20-1927 Today's Date: 08/29/2013 Time: [id number] PT Time Calculation (min): 42 min  PT Assessment / Plan / Recommendation  History of Present Illness s/p L hip hemiarthroplasty after sustaining L femoral neck fx due to fall at [facility]   PT Comments   Pt in bed in L hip IR in KI. Pt all twisted up. Assisted to EOB and tolerated taking steps with RW to recliner. Pt's son present. Plans for snf.  Follow Up Recommendations  SNF     Does the patient have the potential to tolerate intense rehabilitation     Barriers to Discharge        Equipment Recommendations  None recommended by PT    Recommendations for Other Services    Frequency Min 3X/week   Progress towards PT Goals Progress towards PT goals: Progressing toward goals  Plan Current plan remains appropriate    Precautions / Restrictions Precautions Precautions: Fall;Posterior Hip Precaution Comments: cues needed for internal rotation Restrictions Weight Bearing Restrictions: Yes Other Position/Activity Restrictions: WBAT   Pertinent Vitals/Pain Indicates L hip is sore.    Mobility  Bed Mobility Overal bed mobility: Needs Assistance;+2 for physical assistance Bed Mobility: Supine to Sit Supine to sit: +2 for physical assistance;Mod assist General bed mobility comments: vcs for technique and assist for LLE/trunk, assist to prevent L hip IR, removed KI for mobility Sit /stand: + 2 mod assist from bed , cues for precautions. ivot bed to recliner w/ + 2 mod assist.    Exercises     PT Diagnosis:    PT Problem List:   PT Treatment Interventions:     PT Goals (current goals can now be found in the care plan section)    Visit Information  Last PT Received On: 08/29/13 Assistance Needed: +2 History of Present Illness: s/p L hip hemiarthroplasty after sustaining L femoral neck fx due to fall at [facility]    Subjective Data       Cognition  Cognition Arousal/Alertness: Awake/alert Behavior During Therapy: WFL for tasks assessed/performed Area of Impairment: Orientation;Memory Orientation Level: Place;Time;Situation Memory: Decreased recall of precautions;Decreased short-term memory General Comments: is able to participate, continues confused about date/ time and situation    Balance  Balance Overall balance assessment: Needs assistance Sitting-balance support: Bilateral upper extremity supported;Feet supported Sitting balance-Leahy Scale: Fair  End of Session PT - End of Session Equipment Utilized During Treatment: Gait belt Activity Tolerance: Patient tolerated treatment well Patient left: in chair;with call bell/phone within reach;with family/visitor present Nurse Communication: Mobility status   GP     Rada HayHill, Todd Whitaker 08/29/2013, 9:34 AM

## 2013-08-29 NOTE — Progress Notes (Signed)
Subjective: 3 Days Post-Op Procedure(s) (LRB): ARTHROPLASTY BIPOLAR HIP (Left)  Activity level:  wbat post hip precautions Diet tolerance:  ok Voiding:  ok Patient reports pain as 1 on 0-10 scale.    Objective: Vital signs in last 24 hours: Temp:  [98.5 F (36.9 C)-99 F (37.2 C)] 98.6 F (37 C) (02/28 0619) Pulse Rate:  [87-112] 100 (02/28 0619) Resp:  [16-20] 20 (02/28 0619) BP: (114-149)/(59-74) 114/59 mmHg (02/28 0619) SpO2:  [92 %] 92 % (02/28 0619)  Labs:  Recent Labs  08/26/13 1100 08/27/13 0350 08/28/13 0658  HGB 13.4 12.2* 11.9*    Recent Labs  08/27/13 0350 08/28/13 0658  WBC 9.5 10.0  RBC 3.87* 3.74*  HCT 36.5* 34.4*  PLT 139* 139*    Recent Labs  08/28/13 0658 08/29/13 0746  NA 133* 131*  K 3.8 4.1  CL 97 99  CO2 23 23  BUN 22 14  CREATININE 0.77 0.68  GLUCOSE 128* 124*  CALCIUM 8.2* 7.7*    Recent Labs  08/26/13 1100  INR 1.01    Physical Exam:  Neurologically intact ABD soft Neurovascular intact Sensation intact distally Incision: dressing C/D/I  Assessment/Plan:  3 Days Post-Op Procedure(s) (LRB): ARTHROPLASTY BIPOLAR HIP (Left) Advance diet Up with therapy Discharge to SNF Monday per medicine Cont ASA 325 for DVT prophylaxis x 4 weeks WBAT with post hip precautions ASA and pain med Rx on chart     Todd Whitaker, Todd Whitaker 08/29/2013, 8:34 AM

## 2013-08-29 NOTE — Progress Notes (Signed)
Physical Therapy Treatment Patient Details Name: Todd Whitaker MRN: [id number] DOB: [birth year] Today's Date: 08/29/2013 Time: [id number] PT Time Calculation (min): 29 min  PT Assessment / Plan / Recommendation  History of Present Illness s/p L hip hemiarthroplasty after sustaining L femoral neck fx due to fall at [facility]   PT Comments   Pt ambulated in hall and to bathroom. Continues to improve, MS clearer  Follow Up Recommendations  SNF     Does the patient have the potential to tolerate intense rehabilitation     Barriers to Discharge        Equipment Recommendations       Recommendations for Other Services    Frequency Min 3X/week   Progress towards PT Goals Progress towards PT goals: Progressing toward goals  Plan Current plan remains appropriate    Precautions / Restrictions Precautions Precautions: Fall;Posterior Hip Precaution Comments: cues needed for internal rotation   Pertinent Vitals/Pain L hip is sore per pt., positioned on pillow, KI in place    Mobility  Bed Mobility Sit to supine: +2 for physical assistance;+2 for safety/equipment General bed mobility comments: vcs for technique and assist for LLE/trunk, assist to prevent L hip IR, replaced  KI for safety after in bed., pillows between and under legs Transfers Overall transfer level: Needs assistance Equipment used: Rolling walker (2 wheeled) Transfers: Sit to/from Stand Sit to Stand: Mod assist;+2 physical assistance General transfer comment: multimodal cues, support to prevent L hip IR Ambulation/Gait Ambulation/Gait assistance: +2 physical assistance;+2 safety/equipment;Mod assist Ambulation Distance (Feet): 25 Feet Assistive device: Rolling walker (2 wheeled) Gait Pattern/deviations: Step-to pattern;Antalgic;Trunk flexed Gait velocity: decr General Gait Details: multimodal cues for sequence, step length, RW distance, posture.    Exercises Total Joint Exercises Heel Slides: AAROM;Left;10  reps;Supine Hip ABduction/ADduction: AAROM;Left;10 reps;Supine   PT Diagnosis:    PT Problem List:   PT Treatment Interventions:     PT Goals (current goals can now be found in the care plan section)    Visit Information  Last PT Received On: 08/29/13 Assistance Needed: +2 History of Present Illness: s/p L hip hemiarthroplasty after sustaining L femoral neck fx due to fall at [facility]    Subjective Data      Cognition  Cognition Arousal/Alertness: Awake/alert Behavior During Therapy: WFL for tasks assessed/performed    Balance     End of Session PT - End of Session Equipment Utilized During Treatment: Gait belt Activity Tolerance: Patient tolerated treatment well Patient left: in bed;with call bell/phone within reach;with family/visitor present;with bed alarm set Nurse Communication: Mobility status   GP     Rada HayHill, Tonna Palazzi Elizabeth 08/29/2013, 4:34 PM

## 2013-08-29 NOTE — Progress Notes (Signed)
[facility] PROGRESS NOTE  Todd Whitaker:[id number] DOB: Apr 15, 1928 DOA: 08/26/2013 PCP: Mickie Hillier, MD  Assessment/Plan   Subcapital fracture of the left hip s/p left hip arthroplasty on 2/25 by Dr. Luiz Blare -  DVT proph and weight bearing status per Ortho -  Pain control per ortho -  Appreciate orthopedic assistance -  PT/OT rec SNF -  SW to assist with SNF placement  Aspiration pneumonia with fever and cough, afebrile  -  D/c zosyn -  Start augmentin -  Appreciate speech therapy assistance -  MBS:  Concerning for aspiration of particulates and complicated by lordosis -  Dysphagia 3 with thin liquids and extra sauces  Hypertension, blood pressure trending down towards normal range today -Continue home dose of ACE inhibitor  Abnormal CT scan of the brain:  hypodensity in the left cerebellar hemisphere which is likely technical in nature, but cannot rule out an acute ischemic change.  -  Son describes TIA-like episode a few months ago that results in balance problems.   -  Repeat CT brain:  Limited by artifact, otherwise stable  Dementia, with sundowning -  Defer work up for reversible causes to PCP -  Lights on during day and off at night -  Frequent reorientation -  Haldol prn  Hx of SIADH and sodium stable now 131 -  D/c IVF -  Encourage PO intake  Acute urinary retention likely side effect of narcotics -  Coude catheter -  Continue flomax  -  D/c catheter in AM and monitor uop/check PVR  -  If retaining urine, replace coude and urology follow up   Acute blood loss anemia   -  Continue iron supplementation -  Repeat CBC in AM  Mild thrombocytopenia, acute phase reactant and possibly some consumption due to recent surgery.  Stable.    Diet:  Regular  Access:  PIV IVF:  off Proph:  ASA 325mg  BID + SCDs  Code Status: full Family Communication: spoke with patient and his son Disposition Plan: to SNF Monday   Consultants:  Dr. Luiz Blare,  orthopedics  Procedures:  Left hip hemiarthroplasty 2/25  Antibiotics:  Zosyn 2/27 >> 2/28  Augmentin 2/28 >>  HPI/Subjective:  Pain improving in his left leg, but otherwise feeling well.  Slept well and less confused today.    Objective: Filed Vitals:   08/28/13 2257 08/29/13 0619 08/29/13 0922 08/29/13 1418  BP: 149/74 114/59 115/60 99/60  Pulse: 112 100  98  Temp: 99 F (37.2 C) 98.6 F (37 C)  97.6 F (36.4 C)  TempSrc: Oral Oral  Oral  Resp: 20 20  18   Height:      Weight:      SpO2: 92% 92%  96%    Intake/Output Summary (Last 24 hours) at 08/29/13 1517 Last data filed at 08/29/13 1410  Gross per 24 hour  Intake 1099.58 ml  Output    875 ml  Net 224.58 ml   Filed Weights   08/26/13 1600  Weight: 62.596 kg (138 lb)    Exam:   General:  CM, No acute distress  HEENT:  NCAT, MMM  Cardiovascular:  RRR, nl S1, S2 no mrg, 2+ pulses, warm extremities  Respiratory:  rales at the right base, no wheezes or rhonchi, no increased WOB  Abdomen:   NABS, soft, NT/ND  MSK:   Normal tone and bulk, no LEE.  Knee braced.  Bandage left lateral hip c/d/i.  Minimal swelling and bruising.  Neuro:  Grossly intact  Data Reviewed: Basic Metabolic Panel:  Recent Labs Lab 08/26/13 1100 08/27/13 0350 08/28/13 0658 08/29/13 0746  NA 136* 133* 133* 131*  K 4.0 4.1 3.8 4.1  CL 99 99 97 99  CO2 22 22 23 23   GLUCOSE 127* 159* 128* 124*  BUN 21 14 22 14   CREATININE 0.71 0.73 0.77 0.68  CALCIUM 8.7 8.0* 8.2* 7.7*   Liver Function Tests: No results found for this basename: AST, ALT, ALKPHOS, BILITOT, PROT, ALBUMIN,  in the last 168 hours No results found for this basename: LIPASE, AMYLASE,  in the last 168 hours No results found for this basename: AMMONIA,  in the last 168 hours CBC:  Recent Labs Lab 08/26/13 1100 08/27/13 0350 08/28/13 0658  WBC 6.9 9.5 10.0  NEUTROABS 5.9  --   --   HGB 13.4 12.2* 11.9*  HCT 40.1 36.5* 34.4*  MCV 94.1 94.3 92.0  PLT  149* 139* 139*   Cardiac Enzymes: No results found for this basename: CKTOTAL, CKMB, CKMBINDEX, TROPONINI,  in the last 168 hours BNP (last 3 results) No results found for this basename: PROBNP,  in the last 8760 hours CBG: No results found for this basename: GLUCAP,  in the last 168 hours  Recent Results (from the past 240 hour(s))  SURGICAL PCR SCREEN     Status: Abnormal   Collection Time    08/26/13  4:15 PM      Result Value Ref Range Status   MRSA, PCR INVALID RESULTS, SPECIMEN SENT FOR CULTURE (*) NEGATIVE Final   Comment: Chinita Pester RN [phone] 08/26/13 A NAVARRO   Staphylococcus aureus INVALID RESULTS, SPECIMEN SENT FOR CULTURE (*) NEGATIVE Final   Comment:            The Xpert SA Assay (FDA     approved for NASAL specimens     in patients over 4 years of age),     is one component of     a comprehensive surveillance     program.  Test performance has     been validated by [employer] for patients greater     than or equal to 70 year old.     It is not intended     to diagnose infection nor to     guide or monitor treatment.  MRSA CULTURE     Status: None   Collection Time    08/26/13  4:15 PM      Result Value Ref Range Status   Specimen Description NOSE   Final   Special Requests NONE   Final   Culture     Final   Value: NO STAPHYLOCOCCUS AUREUS ISOLATED     Note: No MRSA Isolated     Performed at [facility]   Report Status 08/29/2013 FINAL   Final  SURGICAL PCR SCREEN     Status: Abnormal   Collection Time    08/27/13  3:06 AM      Result Value Ref Range Status   MRSA, PCR INVALID RESULTS, SPECIMEN SENT FOR CULTURE (*) NEGATIVE Final   Comment: INFORMED D. HOLCOMB RN AT 9604 ON 02.26.15 BY SHUEA   Staphylococcus aureus INVALID RESULTS, SPECIMEN SENT FOR CULTURE (*) NEGATIVE Final   Comment: INFORMED D. HOLCOMB RN AT [id number] ON 02.26.15 BY SHUEA                The Xpert SA Assay (FDA     approved  for NASAL specimens     in patients over 41  years of age),     is one component of     a comprehensive surveillance     program.  Test performance has     been validated by [employer] for patients greater     than or equal to 17 year old.     It is not intended     to diagnose infection nor to     guide or monitor treatment.  MRSA CULTURE     Status: None   Collection Time    08/27/13  3:15 AM      Result Value Ref Range Status   Specimen Description NOSE   Final   Special Requests NONE   Final   Culture     Final   Value: NO STAPHYLOCOCCUS AUREUS ISOLATED     Note: No MRSA Isolated     Performed at [facility]   Report Status 08/29/2013 FINAL   Final  URINE CULTURE     Status: None   Collection Time    08/27/13  3:18 PM      Result Value Ref Range Status   Specimen Description URINE, CATHETERIZED   Final   Special Requests Ancef IV Normal   Final   Culture  Setup Time     Final   Value: 08/27/2013 23:42     Performed at [facility] Count     Final   Value: NO GROWTH     Performed at [facility]   Culture     Final   Value: NO GROWTH     Performed at [facility]   Report Status 08/28/2013 FINAL   Final     Studies: Ct Head Wo Contrast  08/27/2013   CLINICAL DATA:  Followup abnormal head CT.  EXAM: CT HEAD WITHOUT CONTRAST  TECHNIQUE: Contiguous axial images were obtained from the base of the skull through the vertex without intravenous contrast.  COMPARISON:  08/26/2013 and 01/29/2011  FINDINGS: Motion again degree hazy images. This particularly limits evaluation of the inferior posterior fossa. The reported subtle asymmetric hypoattenuation in the left cerebellar hemispheres is not demonstrated on the current exam, but this area is poorly visualized.  The ventricles are normal in configuration. There is ventricular and sulcal enlargement reflecting moderate atrophy. No hydrocephalus.  No parenchymal masses or mass effect. Patchy areas of white matter hypoattenuation  are noted most consistent with mild chronic microvascular ischemic change.  There is no evidence of a recent cortical infarct.  There are no extra-axial masses or abnormal fluid collections.  There is no intracranial hemorrhage.  Visualized sinuses and mastoid air cells are clear.  IMPRESSION: 1. Study is limited by motion, which particularly affects evaluation of the posterior fossa. The questionable asymmetric hypoattenuation in the left cerebellar hemisphere noted on the previous exam is not well evaluated currently. 2. Allowing for the motion limitation, there is no evidence of acute intracranial pathology. 3. Moderate atrophy and mild chronic microvascular ischemic change.   Electronically Signed   By: Amie Portland M.D.   On: 08/27/2013 15:35   Dg Chest Port 1 View  08/27/2013   CLINICAL DATA:  New onset of fever.  Recent hip surgery.  EXAM: PORTABLE CHEST - 1 VIEW  COMPARISON:  08/26/2013.  FINDINGS: New right base opacity silhouettes the elevated right hemidiaphragm, concerning for pneumonia. No pneumothorax. No significant volume loss.  Stable cardiomegaly and vascular calcification. Osteopenia.  IMPRESSION: New right base infiltrate, suspicious for pneumonia.   Electronically Signed   By: Davonna Belling M.D.   On: 08/27/2013 19:55   Dg Swallowing Func-speech Pathology  08/28/2013   Chales Abrahams, CCC-SLP     08/28/2013  4:14 PM    Objective Swallowing Evaluation: Modified Barium Swallowing  Study  Patient Details  Name: JAYVEON CONVEY MRN: [id number] Date of Birth: December 09, 1927  Today's Date: 08/28/2013 Time: 4098-1191 SLP Time Calculation (min): 28 min  Past Medical History:  Past Medical History  Diagnosis Date  . Hypertension   . Glaucoma   . Osteoporosis   . Hyperlipidemia   . SIADH (syndrome of inappropriate ADH production)    Past Surgical History:  Past Surgical History  Procedure Laterality Date  . Cataract extraction, bilateral    . Transurethral resection of prostate    . Hip arthroplasty Left  08/26/2013    Procedure: ARTHROPLASTY BIPOLAR HIP;  Surgeon: Harvie Junior,  MD;  Location: [facility];  Service: Orthopedics;  Laterality: Left;   HPI:  80 yo male adm to [facility] with left hip fx, s/p left orthoplasty,  post op fever with CXR indicating right lower lobe pna.  CT head  showed ? left cerebellar hypoattentuation.  Pt has h/o dementia  *son reports progressive decline in the last few months and HTN.   Son stated he eats with pt on weekends and he has not noticed pt  having dysphagia prior to this admit.       Assessment / Plan / Recommendation Clinical Impression  Dysphagia Diagnosis: Moderate oral phase dysphagia;Mild cervical  esophageal phase dysphagia;Moderate pharyngeal phase dysphagia Clinical impression: Moderate oropharyngeal and mild cervical  esophageal dysphagia present with sensorimotor deficits.  Pt also  appears with significant lordosis (radiologist not present to  confirm) that prevents adequately epiglottic deflection allowing  significant stasis at vallecular space more than pyriform sinus  region and likely decreased UES clearance as well.  Mild amount  of aspiration with cough response noted with thin when attempting  head turn, right/left and chin tuck.   Neutral position appeared  most effective in swallowing without overt aspiration noted.   Oral propulsion impaired evident by oral tongue base residuals  without pt sensation.  Following solids with liquds helpful to  decrease oropharyngeal stasis.  Trace penetration of thin cleared  with cued throat clear/cough.  Please note, pt was conducting  throat clear during MBS without barium always visualized in  larynx/trachea.  Recommend soft diet/ground meats with thin  liquids with strict precautions to mitigate aspiration risk.  Recommend avoiding particulate foods due to concerns for  aspiration of small particles.  Suspect component of chronic  deficits given appearance of lordosis for which pt has managed  prior to this event.  Son Viviann Spare  present and son/pt educated to  findings and recommendations using monitor/verbal/diagram for  comprehension.    Recommend follow up SLP to maximize pt's  swallow rehabiliation with goal to return to premorbid level.      Treatment Recommendation  Therapy as outlined in treatment plan below    Diet Recommendation Dysphagia 3 (Mechanical Soft);Thin liquid  (ground meat, extra gravy/sauces)   Liquid Administration via: Cup;Straw Medication Administration: Crushed with puree Supervision: Patient able to self feed Compensations: Slow rate;Small sips/bites;Multiple dry swallows  after each bite/sip;Follow solids with liquid (start meal with  liquids, prefer water) Postural Changes and/or Swallow Maneuvers: Seated upright 90  degrees;Upright 30-60  min after meal    Other  Recommendations Recommended Consults: MBS Oral Care Recommendations: Oral care before and after PO   Follow Up Recommendations  Skilled Nursing facility    Frequency and Duration min 2x/week  2 weeks   Pertinent Vitals/Pain Today afebrile, decreased   SLP Swallow Goals     General Date of Onset: 08/28/13 HPI: 80 yo male adm to [facility] with left hip fx, s/p left  orthoplasty, post op fever with CXR indicating right lower lobe  pna.  CT head showed ? left cerebellar hypoattentuation.  Pt has  h/o dementia *son reports progressive decline in the last few  months and HTN.  Son stated he eats with pt on weekends and he  has not noticed pt having dysphagia prior to this admit.   Type of Study: Modified Barium Swallowing Study Reason for Referral: Objectively evaluate swallowing function Diet Prior to this Study: Dysphagia 2 (chopped);Thin liquids Temperature Spikes Noted:  (yesterday febrile, today afebrile) Respiratory Status: Room air History of Recent Intubation: Yes Length of Intubations (days):  (for surgery only) Behavior/Cognition: Alert;Cooperative;Decreased sustained  attention;Doesn't follow directions Oral Cavity - Dentition: Adequate natural dentition  Oral Motor / Sensory Function: Within functional limits Self-Feeding Abilities: Able to feed self Patient Positioning: Upright in chair Baseline Vocal Quality: Low vocal intensity (improved today  compared to yesterday) Volitional Cough: Strong Volitional Swallow: Able to elicit Anatomy:  (pt with appearance of significant lordosis of cervical  spine, radiologist not present to confirm) Pharyngeal Secretions: Standing secretions in (comment) (pyriform  sinus)    Reason for Referral Objectively evaluate swallowing function   Oral Phase Oral Preparation/Oral Phase Oral Phase: Impaired Oral - Nectar Oral - Nectar Teaspoon: Weak lingual manipulation;Reduced  posterior propulsion Oral - Nectar Cup: Reduced posterior propulsion;Weak lingual  manipulation Oral - Thin Oral - Thin Teaspoon: Reduced posterior propulsion;Weak lingual  manipulation Oral - Thin Cup: Reduced posterior propulsion;Weak lingual  manipulation Oral - Thin Straw: Weak lingual manipulation;Reduced posterior  propulsion Oral - Solids Oral - Puree: Reduced posterior propulsion;Weak lingual  manipulation;Lingual/palatal residue Oral - Regular: Reduced posterior propulsion;Weak lingual  manipulation;Lingual/palatal residue Oral Phase - Comment Oral Phase - Comment: base of tongue stasis noted with thicker  consistencies more than thin without pt awareness   Pharyngeal Phase Pharyngeal Phase Pharyngeal Phase: Impaired Pharyngeal - Nectar Pharyngeal - Nectar Teaspoon: Reduced epiglottic  inversion;Reduced tongue base retraction;Pharyngeal residue -  valleculae;Pharyngeal residue - pyriform sinuses Pharyngeal - Nectar Cup: Reduced epiglottic inversion;Reduced  tongue base retraction;Pharyngeal residue - valleculae;Pharyngeal  residue - pyriform sinuses Pharyngeal - Thin Pharyngeal - Thin Teaspoon: Pharyngeal residue - pyriform  sinuses;Pharyngeal residue - valleculae;Reduced epiglottic  inversion;Reduced tongue base retraction Pharyngeal - Thin Cup: Reduced  epiglottic inversion;Reduced  tongue base retraction;Pharyngeal residue - valleculae;Pharyngeal  residue - pyriform sinuses;Moderate aspiration Pharyngeal - Thin Straw: Reduced epiglottic inversion;Reduced  tongue base retraction;Moderate aspiration Pharyngeal - Solids Pharyngeal - Puree: Reduced pharyngeal peristalsis;Reduced  epiglottic inversion;Pharyngeal residue - valleculae Pharyngeal - Regular: Reduced epiglottic inversion;Reduced tongue  base retraction;Pharyngeal residue - valleculae Pharyngeal Phase - Comment Pharyngeal Comment: attempts at head turn right or left and chin  tuck were not effective to prevent laryngeal penetration, head  neutral best position for pt, pt does not sense pharyngeal stasis  and did not "hock" adequately to clear, following solids with  liquids and conducting mulitple swallows effective to decrease  stasis, intermittent throat clear/cough cleared trace  penetrates/aspirates  Cervical Esophageal Phase    GO    Cervical Esophageal  Phase Cervical Esophageal Phase: Impaired Cervical Esophageal Phase - Nectar Nectar Teaspoon: Reduced cricopharyngeal relaxation Nectar Cup: Reduced cricopharyngeal relaxation Cervical Esophageal Phase - Thin Thin Teaspoon: Reduced cricopharyngeal relaxation Thin Cup: Reduced cricopharyngeal relaxation Thin Straw: Reduced cricopharyngeal relaxation Cervical Esophageal Phase - Solids Puree: Reduced cricopharyngeal relaxation Regular: Reduced cricopharyngeal relaxation Cervical Esophageal Phase - Comment Cervical Esophageal Comment: appearance of secretions mixed with  barium in pyriform sinus noted, suspect lordosis impacting  pharyngeal clearance into esophagus         Donavan Burnetamara Kimball, MS [facility] SLP [phone]     Scheduled Meds: . amoxicillin-clavulanate  1 tablet Oral Q12H  . aspirin EC  325 mg Oral BID PC  . docusate sodium  100 mg Oral BID  . ferrous sulfate  325 mg Oral Q breakfast  . pilocarpine  1 drop Both Eyes QID  . ramipril  10 mg Oral  Daily  . senna  2 tablet Oral QHS  . tamsulosin  0.4 mg Oral QPC supper  . timolol  1 drop Both Eyes Daily   Continuous Infusions:    Principal Problem:   Subcapital fracture of left hip Active Problems:   HTN (hypertension)   Abnormal CT of brain   Closed left hip fracture   Acute blood loss anemia   Thrombocytopenia, unspecified   Hyponatremia   Postoperative fever   Aspiration pneumonia   Dysphagia, unspecified(787.20)    Time spent: 30 min    Bronislaw Switzer  [facility] Pager [phone]. If 7PM-7AM, please contact night-coverage at [email or web address], password [email or web address] 08/29/2013, 3:17 PM  LOS: 3 days

## 2013-08-30 DIAGNOSIS — E440 Moderate protein-calorie malnutrition: Secondary | ICD-10-CM

## 2013-08-30 DIAGNOSIS — R319 Hematuria, unspecified: Secondary | ICD-10-CM

## 2013-08-30 MED ORDER — PHENAZOPYRIDINE HCL 100 MG PO TABS
100.0000 mg | ORAL_TABLET | Freq: Three times a day (TID) | ORAL | Status: DC
  Administered 2013-08-31 (×2): 100 mg via ORAL
  Filled 2013-08-30 (×3): qty 1

## 2013-08-30 MED ORDER — SODIUM CHLORIDE 0.9 % IV SOLN
INTRAVENOUS | Status: DC
  Administered 2013-08-31: via INTRAVENOUS

## 2013-08-30 MED ORDER — ENSURE COMPLETE PO LIQD
237.0000 mL | Freq: Two times a day (BID) | ORAL | Status: DC
  Administered 2013-08-31: 237 mL via ORAL

## 2013-08-30 MED ORDER — BOOST / RESOURCE BREEZE PO LIQD: 1.0000 | Freq: Three times a day (TID) | ORAL | Status: DC

## 2013-08-30 NOTE — Plan of Care (Signed)
Problem: Phase III Progression Outcomes Goal: Anticoagulant follow-up in place Outcome: Not Applicable Date Met:  43/27/61 asa

## 2013-08-30 NOTE — Progress Notes (Signed)
[facility] PROGRESS NOTE  Todd Whitaker ZOX:[id number] DOB: 04-Mar-1928 DOA: 08/26/2013 PCP: Mickie Hillier, MD  Assessment/Plan   Subcapital fracture of the left hip s/p left hip arthroplasty on 2/25 by Dr. Luiz Blare.  Healing well.  -  SNF Monday  Aspiration pneumonia with fever and cough, afebrile  -  Continue augmentin -  Appreciate speech therapy assistance -  MBS:  Concerning for aspiration of particulates and complicated by lordosis -  Dysphagia 3 with thin liquids and extra sauces  Hypertension, blood pressure trending down towards normal range today -Continue home dose of ACE inhibitor  Abnormal CT scan of the brain:  hypodensity in the left cerebellar hemisphere which is likely technical in nature, but cannot rule out an acute ischemic change.  -  Son describes TIA-like episode a few months ago that results in balance problems.   -  Repeat CT brain:  Limited by artifact, otherwise stable  Dementia, with sundowning -  Defer work up for reversible causes to PCP -  Lights on during day and off at night -  Frequent reorientation -  Haldol prn  Hx of SIADH  -  Repeat NA in AM  Acute urinary retention likely side effect of narcotics, unable to void this AM.  -  Hematuria after coude catheter replaced -  Flush prn -  Pyridium   Acute blood loss anemia -  Continue iron supplementation  Mild thrombocytopenia, acute phase reactant and possibly some consumption due to recent surgery.  Stable.    Moderate protein calorie malnutrition:  Normally has good appetite, but poor since surgery -  Nutrition consult -  Start supplements -  Encourage fluids -  Restart IVF tonight  Diet:  dsyphagia Access:  PIV IVF:  yes Proph:  ASA 325mg  BID + SCDs  Code Status: full Family Communication: spoke with patient and his son Disposition Plan: to SNF Monday   Consultants:  Dr. Luiz Blare, orthopedics  Procedures:  Left hip hemiarthroplasty 2/25  Antibiotics:  Zosyn  2/27 >> 2/28  Augmentin 2/28 >>  HPI/Subjective:  Pain improving in his left leg.  Hematuria adn penile pain after coude reinsertion  Objective: Filed Vitals:   08/29/13 2100 08/29/13 2330 08/30/13 0611 08/30/13 1334  BP: 99/60  134/69 136/69  Pulse: 91  90 87  Temp: 97.6 F (36.4 C)  98.2 F (36.8 C) 98.9 F (37.2 C)  TempSrc: Oral  Oral Oral  Resp: 20 18 18 18   Height:      Weight:      SpO2: 94%  93% 94%    Intake/Output Summary (Last 24 hours) at 08/30/13 2022 Last data filed at 08/30/13 1400  Gross per 24 hour  Intake    240 ml  Output    600 ml  Net   -360 ml   Filed Weights   08/26/13 1600  Weight: 62.596 kg (138 lb)    Exam:   General:  CM, No acute distress  HEENT:  NCAT, MMM  Cardiovascular:  RRR, nl S1, S2 no mrg, 2+ pulses, warm extremities  Respiratory:  Decreased rales at the right base, no wheezes or rhonchi, no increased WOB  Abdomen:   NABS, soft, NT/ND  MSK:   Normal tone and bulk, no LEE.  Knee braced.  Bandage left lateral hip c/d/i.  + swelling and bruising.  Neuro:  Grossly intact  Data Reviewed: Basic Metabolic Panel:  Recent Labs Lab 08/26/13 1100 08/27/13 0350 08/28/13 0658 08/29/13 0746  NA 136* 133* 133*  131*  K 4.0 4.1 3.8 4.1  CL 99 99 97 99  CO2 22 22 23 23   GLUCOSE 127* 159* 128* 124*  BUN 21 14 22 14   CREATININE 0.71 0.73 0.77 0.68  CALCIUM 8.7 8.0* 8.2* 7.7*   Liver Function Tests: No results found for this basename: AST, ALT, ALKPHOS, BILITOT, PROT, ALBUMIN,  in the last 168 hours No results found for this basename: LIPASE, AMYLASE,  in the last 168 hours No results found for this basename: AMMONIA,  in the last 168 hours CBC:  Recent Labs Lab 08/26/13 1100 08/27/13 0350 08/28/13 0658  WBC 6.9 9.5 10.0  NEUTROABS 5.9  --   --   HGB 13.4 12.2* 11.9*  HCT 40.1 36.5* 34.4*  MCV 94.1 94.3 92.0  PLT 149* 139* 139*   Cardiac Enzymes: No results found for this basename: CKTOTAL, CKMB, CKMBINDEX,  TROPONINI,  in the last 168 hours BNP (last 3 results) No results found for this basename: PROBNP,  in the last 8760 hours CBG: No results found for this basename: GLUCAP,  in the last 168 hours  Recent Results (from the past 240 hour(s))  SURGICAL PCR SCREEN     Status: Abnormal   Collection Time    08/26/13  4:15 PM      Result Value Ref Range Status   MRSA, PCR INVALID RESULTS, SPECIMEN SENT FOR CULTURE (*) NEGATIVE Final   Comment: Chinita PesterCALLED A FARGO RN [phone] 08/26/13 A NAVARRO   Staphylococcus aureus INVALID RESULTS, SPECIMEN SENT FOR CULTURE (*) NEGATIVE Final   Comment:            The Xpert SA Assay (FDA     approved for NASAL specimens     in patients over 78 years of age),     is one component of     a comprehensive surveillance     program.  Test performance has     been validated by [facility] for patients greater     than or equal to 78 year old.     It is not intended     to diagnose infection nor to     guide or monitor treatment.  MRSA CULTURE     Status: None   Collection Time    08/26/13  4:15 PM      Result Value Ref Range Status   Specimen Description NOSE   Final   Special Requests NONE   Final   Culture     Final   Value: NO STAPHYLOCOCCUS AUREUS ISOLATED     Note: No MRSA Isolated     Performed at [facility]   Report Status 08/29/2013 FINAL   Final  SURGICAL PCR SCREEN     Status: Abnormal   Collection Time    08/27/13  3:06 AM      Result Value Ref Range Status   MRSA, PCR INVALID RESULTS, SPECIMEN SENT FOR CULTURE (*) NEGATIVE Final   Comment: INFORMED D. HOLCOMB RN AT [id number] ON 02.26.15 BY SHUEA   Staphylococcus aureus INVALID RESULTS, SPECIMEN SENT FOR CULTURE (*) NEGATIVE Final   Comment: INFORMED D. HOLCOMB RN AT [id number] ON 02.26.15 BY SHUEA                The Xpert SA Assay (FDA     approved for NASAL specimens     in patients over 78 years of age),     is one component of  a comprehensive surveillance     program.  Test  performance has     been validated by [facility] for patients greater     than or equal to 64 year old.     It is not intended     to diagnose infection nor to     guide or monitor treatment.  MRSA CULTURE     Status: None   Collection Time    08/27/13  3:15 AM      Result Value Ref Range Status   Specimen Description NOSE   Final   Special Requests NONE   Final   Culture     Final   Value: NO STAPHYLOCOCCUS AUREUS ISOLATED     Note: No MRSA Isolated     Performed at [facility]   Report Status 08/29/2013 FINAL   Final  URINE CULTURE     Status: None   Collection Time    08/27/13  3:18 PM      Result Value Ref Range Status   Specimen Description URINE, CATHETERIZED   Final   Special Requests Ancef IV Normal   Final   Culture  Setup Time     Final   Value: 08/27/2013 23:42     Performed at [facility] Count     Final   Value: NO GROWTH     Performed at [facility]   Culture     Final   Value: NO GROWTH     Performed at [facility]   Report Status 08/28/2013 FINAL   Final     Studies: No results found.  Scheduled Meds: . amoxicillin-clavulanate  1 tablet Oral Q12H  . aspirin EC  325 mg Oral BID PC  . docusate sodium  100 mg Oral BID  . [START ON 08/31/2013] feeding supplement (ENSURE COMPLETE)  237 mL Oral BID BM  . feeding supplement (RESOURCE BREEZE)  1 Container Oral TID BM  . ferrous sulfate  325 mg Oral Q breakfast  . [START ON 08/31/2013] phenazopyridine  100 mg Oral TID WC  . pilocarpine  1 drop Both Eyes QID  . ramipril  10 mg Oral Daily  . senna  2 tablet Oral QHS  . tamsulosin  0.4 mg Oral QPC supper  . timolol  1 drop Both Eyes Daily   Continuous Infusions: . sodium chloride      Principal Problem:   Subcapital fracture of left hip Active Problems:   HTN (hypertension)   Abnormal CT of brain   Closed left hip fracture   Acute blood loss anemia   Thrombocytopenia, unspecified   Hyponatremia    Postoperative fever   Aspiration pneumonia   Dysphagia, unspecified(787.20)    Time spent: 30 min    Chalsea Darko  [facility] Pager [phone]. If 7PM-7AM, please contact night-coverage at [email or web address], password [facility] 08/30/2013, 8:22 PM  LOS: 4 days

## 2013-08-30 NOTE — Progress Notes (Signed)
Physical Therapy Treatment Patient Details Name: Todd Whitaker D Galloway MRN: [id number] DOB: Jul 03, 1927 Today's Date: 08/30/2013 Time: [id number] PT Time Calculation (min): 32 min  PT Assessment / Plan / Recommendation  History of Present Illness s/p L hip hemiarthroplasty after sustaining L femoral neck fx due to fall at [facility]F   PT Comments   **Pt pleasant and puts forth good effort. Frequent cuing required for sequencing and positioning with ambulation. Pt recalled 1/3 posterior hip precautions following review of them. *  Follow Up Recommendations  SNF     Does the patient have the potential to tolerate intense rehabilitation     Barriers to Discharge        Equipment Recommendations  None recommended by PT    Recommendations for Other Services    Frequency Min 3X/week   Progress towards PT Goals Progress towards PT goals: Progressing toward goals  Plan Current plan remains appropriate    Precautions / Restrictions Precautions Precautions: Fall;Posterior Hip Precaution Booklet Issued: Yes (comment) Precaution Comments: cues needed for internal rotation; pt able to recall 1 of 3 precautions prior to and after review of precautions Restrictions Other Position/Activity Restrictions: WBAT   Pertinent Vitals/Pain **7/10 L hip with walking, 5/10 at rest Ice applied*    Mobility  Transfers Overall transfer level: Needs assistance Equipment used: Rolling walker (2 wheeled) Transfers: Sit to/from Stand Sit to Stand: Mod assist;+2 physical assistance General transfer comment: multimodal cues, support to prevent L hip IR Ambulation/Gait Ambulation/Gait assistance: +2 physical assistance;+2 safety/equipment Ambulation Distance (Feet): 40 Feet Assistive device: Rolling walker (2 wheeled) Gait Pattern/deviations: Step-to pattern;Trunk flexed Gait velocity: decr General Gait Details: multimodal cues for sequence, step length, RW distance, posture.    Exercises Total Joint  Exercises Ankle Circles/Pumps: AROM;Both;10 reps;Supine Quad Sets: AROM;Left;10 reps;Supine Short Arc Quad: AAROM;Left;10 reps;Supine Heel Slides: AAROM;Left;10 reps;Supine Hip ABduction/ADduction: AAROM;Left;10 reps;Supine   PT Diagnosis:    PT Problem List:   PT Treatment Interventions:     PT Goals (current goals can now be found in the care plan section) Acute Rehab PT Goals Patient Stated Goal: none stated.  agreeable to ot PT Goal Formulation: With patient/family Time For Goal Achievement: 09/03/13 Potential to Achieve Goals: Good  Visit Information  Last PT Received On: 08/30/13 Assistance Needed: +2 History of Present Illness: s/p L hip hemiarthroplasty after sustaining L femoral neck fx due to fall at [facility]    Subjective Data  Patient Stated Goal: none stated.  agreeable to ot   Cognition  Cognition Arousal/Alertness: Awake/alert Behavior During Therapy: WFL for tasks assessed/performed Area of Impairment: Orientation;Memory Orientation Level: Place;Time;Situation Memory: Decreased recall of precautions;Decreased short-term memory General Comments: is able to participate, continues confused about date/ time and situation    Balance     End of Session PT - End of Session Equipment Utilized During Treatment: Gait belt Activity Tolerance: Patient tolerated treatment well Patient left: with call bell/phone within reach;with family/visitor present;in chair Nurse Communication: Mobility status   GP     Ralene BatheUhlenberg, Ailea Rhatigan Kistler 08/30/2013, 9:23 AM [phone]

## 2013-08-30 NOTE — Progress Notes (Addendum)
Pt unable to void since catheter dc'd. Dr Malachi BondsShort notified by text & order received. New catheter inserted by coude team.  Cornellius Kropp, Ladona Ridgelaylor

## 2013-08-30 NOTE — Progress Notes (Signed)
Subjective: 4 Days Post-Op Procedure(s) (LRB): ARTHROPLASTY BIPOLAR HIP (Left)  Activity level:  wbat with post precautions Diet tolerance:  Eating well Voiding:  ok Patient reports pain as 1 on 0-10 scale.    Objective: Vital signs in last 24 hours: Temp:  [97.6 F (36.4 C)-98.2 F (36.8 C)] 98.2 F (36.8 C) (03/01 0611) Pulse Rate:  [90-98] 90 (03/01 0611) Resp:  [18-20] 18 (03/01 0611) BP: (99-134)/(60-69) 134/69 mmHg (03/01 0611) SpO2:  [93 %-96 %] 93 % (03/01 0611)  Labs:  Recent Labs  08/28/13 0658  HGB 11.9*    Recent Labs  08/28/13 0658  WBC 10.0  RBC 3.74*  HCT 34.4*  PLT 139*    Recent Labs  08/28/13 0658 08/29/13 0746  NA 133* 131*  K 3.8 4.1  CL 97 99  CO2 23 23  BUN 22 14  CREATININE 0.77 0.68  GLUCOSE 128* 124*  CALCIUM 8.2* 7.7*   No results found for this basename: LABPT, INR,  in the last 72 hours  Physical Exam:  Neurologically intact ABD soft Neurovascular intact Sensation intact distally Incision: dressing C/D/I No cellulitis present  Assessment/Plan:  4 Days Post-Op Procedure(s) (LRB): ARTHROPLASTY BIPOLAR HIP (Left) Advance diet Up with therapy Plan for discharge tomorrow Discharge to SNF Dressing change today. Cont ASA 325mg  x 4 wks Pain and ASA Rx are on chart Follow up with Dr. Luiz BlareGraves in 2 weeks.    [name], [name] 08/30/2013, 11:47 AM

## 2013-08-31 DIAGNOSIS — E43 Unspecified severe protein-calorie malnutrition: Secondary | ICD-10-CM | POA: Insufficient documentation

## 2013-08-31 LAB — BASIC METABOLIC PANEL
BUN: 14 mg/dL (ref 6–23)
CALCIUM: 7.9 mg/dL — AB (ref 8.4–10.5)
CO2: 23 mEq/L (ref 19–32)
Chloride: 98 mEq/L (ref 96–112)
Creatinine, Ser: 0.57 mg/dL (ref 0.50–1.35)
GFR calc Af Amer: >90 mL/min (ref >90)
GFR calc non Af Amer: >90 mL/min (ref >90)
Glucose, Bld: 109 mg/dL — ABNORMAL HIGH (ref 70–99)
Potassium: 4.3 mEq/L (ref 3.7–5.3)
SODIUM: 131 meq/L — AB (ref 137–147)

## 2013-08-31 MED ORDER — TAMSULOSIN HCL 0.4 MG PO CAPS: 0.4000 mg | ORAL_CAPSULE | Freq: Every day | ORAL | Status: AC

## 2013-08-31 MED ORDER — DSS 100 MG PO CAPS: 100.0000 mg | ORAL_CAPSULE | Freq: Two times a day (BID) | ORAL | Status: AC

## 2013-08-31 MED ORDER — BOOST / RESOURCE BREEZE PO LIQD: 1.0000 | Freq: Three times a day (TID) | ORAL | Status: AC

## 2013-08-31 MED ORDER — ENSURE COMPLETE PO LIQD
237.0000 mL | Freq: Two times a day (BID) | ORAL | Status: AC
Start: 2013-08-31 — End: ?

## 2013-08-31 MED ORDER — FERROUS SULFATE 325 (65 FE) MG PO TABS: 325.0000 mg | ORAL_TABLET | Freq: Every day | ORAL | Status: AC

## 2013-08-31 MED ORDER — AMOXICILLIN-POT CLAVULANATE 875-125 MG PO TABS: 1.0000 | ORAL_TABLET | Freq: Two times a day (BID) | ORAL | Status: AC

## 2013-08-31 MED ORDER — BISACODYL 10 MG RE SUPP: 10.0000 mg | Freq: Every day | RECTAL | Status: AC | PRN

## 2013-08-31 MED ORDER — HALOPERIDOL 1 MG PO TABS: 1.0000 mg | ORAL_TABLET | Freq: Every evening | ORAL | Status: AC | PRN

## 2013-08-31 NOTE — Progress Notes (Signed)
Physical Therapy Treatment Patient Details Name: Todd Whitaker MRN: [id number] DOB: [birth year] Today's Date: 08/31/2013 Time: 0865-7846 PT Time Calculation (min): 16 min  PT Assessment / Plan / Recommendation  History of Present Illness s/p L hip hemiarthroplasty after sustaining L femoral neck fx due to fall at [facility]F   PT Comments   **Pt is pleasantly confused. He did not recall any posterior hip precautions which we'd discussed in detail yesterday. Re-educated pt/family in precautions. He walked 30' with RW and min A. He is ready to DC to SNF for rehab from PT standpoint. *  Follow Up Recommendations  SNF     Does the patient have the potential to tolerate intense rehabilitation     Barriers to Discharge        Equipment Recommendations  None recommended by PT    Recommendations for Other Services    Frequency Min 3X/week   Progress towards PT Goals Progress towards PT goals: Progressing toward goals  Plan Current plan remains appropriate    Precautions / Restrictions Precautions Precautions: Fall;Posterior Hip Precaution Booklet Issued: Yes (comment) Precaution Comments: cues needed for internal rotation; Pt did not recall any hip precautions, reviewed precautions with pt and family, sign hanging in room Restrictions Other Position/Activity Restrictions: WBAT   Pertinent Vitals/Pain *"it hurts a little"  L hip with walking Pt refused ice**    Mobility  Bed Mobility Overal bed mobility: Needs Assistance;+2 for physical assistance Bed Mobility: Supine to Sit Supine to sit: +2 for physical assistance;Mod assist General bed mobility comments: VCs for technique, assist to rise trunk and to pivot to EOB, assist to support LLE Transfers Overall transfer level: Needs assistance Equipment used: Rolling walker (2 wheeled) Transfers: Sit to/from Stand Sit to Stand: +2 physical assistance;Min assist General transfer comment: verbal cues for safe technique within  precautions Ambulation/Gait Ambulation/Gait assistance: +2 safety/equipment;Min assist Ambulation Distance (Feet): 60 Feet Assistive device: Rolling walker (2 wheeled) Gait Pattern/deviations: Step-to pattern;Decreased step length - left;Decreased step length - right Gait velocity: decr General Gait Details: multimodal cues for sequence, step length, RW distance    Exercises Total Joint Exercises Ankle Circles/Pumps: AROM;Both;10 reps;Supine Heel Slides: AAROM;Left;10 reps;Supine Hip ABduction/ADduction: AAROM;Left;10 reps;Supine   PT Diagnosis:    PT Problem List:   PT Treatment Interventions:     PT Goals (current goals can now be found in the care plan section) Acute Rehab PT Goals Patient Stated Goal: none stated, agreeable to PT PT Goal Formulation: With patient/family Time For Goal Achievement: 09/03/13 Potential to Achieve Goals: Good  Visit Information  Last PT Received On: 08/31/13 Assistance Needed: +2 History of Present Illness: s/p L hip hemiarthroplasty after sustaining L femoral neck fx due to fall at ILF    Subjective Data  Patient Stated Goal: none stated, agreeable to PT   Cognition  Cognition Arousal/Alertness: Awake/alert Behavior During Therapy: WFL for tasks assessed/performed Overall Cognitive Status: History of cognitive impairments - at baseline Area of Impairment: Orientation;Memory Orientation Level: Place;Time;Situation Memory: Decreased recall of precautions;Decreased short-term memory Safety/Judgement: Decreased awareness of safety;Decreased awareness of deficits General Comments: is able to participate, continues confused about date/ time and situation    Balance  Balance Sitting-balance support: Bilateral upper extremity supported;Feet supported Sitting balance-Leahy Scale: Fair  End of Session PT - End of Session Equipment Utilized During Treatment: Gait belt Activity Tolerance: Patient tolerated treatment well Patient left: with call  bell/phone within reach;with family/visitor present;in chair Nurse Communication: Mobility status   GP  Ralene BatheUhlenberg, Belenda Alviar Kistler 08/31/2013, 11:55 AM [phone]

## 2013-08-31 NOTE — Progress Notes (Signed)
INITIAL NUTRITION ASSESSMENT  DOCUMENTATION CODES Per approved criteria  -Severe malnutrition in the context of acute illness or injury   INTERVENTION:  Continue Ensure Complete BID, each supplement provides 350 kcal and 13 grams protein  Continue Resource Breeze TID, each supplement provides 250 kcal and 9 grams protein  Will continue to monitor   NUTRITION DIAGNOSIS: Inadequate oral intake related to poor appetite and swallowing difficulty as evidenced by documented PO intake.   Goal: Patient to meet >/= 90% of estimated nutrition needs.  Monitor:  PO intake and supplement acceptance, I/Os, weight trends, labs  Reason for Assessment: MD Consult for nutrition assessment  80 y.o. male  Admitting Dx: Subcapital fracture of left hip  ASSESSMENT: Patient is an 80 year old white man with history only significant for hypertension, who presents to the hospital today after a mechanical fall in his independent living facility today. He states he was in the shower and slipped hitting his left hip, because of immediate pain he was transferred to the emergency department where a hip x-ray demonstrates evidence for a subcapital left hip fracture. A CT scan of the head was also obtained which showed a hypodensity in the left cerebellar hemisphere which may reflect ischemic change versus technical in nature.   2/25:  Left arthroplasty bipolar hip  Per patient's daughter in law at bedside, patient is confused and unable to provide an accurate nutrition history. Family member reported that patient was not eating well prior to moving into skilled nursing facility in January 2015. Since living at skilled nursing facility patient's pants have been getting tighter and patient has been eating 2 meals per day, however since admission patient's PO intake has been variable; patient has been eating 0-60% of trays. Daughter in law confirms pt's poor intake.    Patient found to have aspiration pneumonia.  Per SLP note, patient with dysphagia and aspiration with some textures. SLP recommends dys 3 with extra gravy and thin liquids.   Nutrition Focused Physical Exam:  Subcutaneous Fat:  Orbital Region: WNL Upper Arm Region: moderate depletion Thoracic and Lumbar Region: mild depletion  Muscle:  Temple Region: mild depletion Clavicle Bone Region: moderate depletion Clavicle and Acromion Bone Region: moderate depletion Scapular Bone Region: moderate depletion Dorsal Hand: moderate depletion Patellar Region: mild depletion Anterior Thigh Region: mild depletion  Posterior Calf Region: N/A  Edema: none noted  Patient meets criteria for severe malnutrition in the context of acute illness/injury as evidenced by moderate body fat and muscle mass depletion and energy intake </=50% for >/=5 days.   Height: Ht Readings from Last 1 Encounters:  08/26/13 5\' 10"  (1.778 m)    Weight: Wt Readings from Last 1 Encounters:  08/26/13 138 lb (62.596 kg)    Ideal Body Weight: 166 lb (75.5 kg)  % Ideal Body Weight: 83%  Wt Readings from Last 10 Encounters:  08/26/13 138 lb (62.596 kg)  08/26/13 138 lb (62.596 kg)  08/26/13 138 lb (62.596 kg)    Usual Body Weight: unknown  % Usual Body Weight: unable to assess  BMI:  Body mass index is 19.8 kg/(m^2).  Estimated Nutritional Needs: Kcal:  1600-1800  Protein: 85-95 grams Fluid: 1.6-1.8 L  Skin: closed incision on left hip  Diet Order: Dysphagia  EDUCATION NEEDS: -No education needs identified at this time   Intake/Output Summary (Last 24 hours) at 08/31/13 0916 Last data filed at 08/31/13 0257  Gross per 24 hour  Intake 581.25 ml  Output    375 ml  Net  206.25 ml    Last BM: 2/28  Labs:   Recent Labs Lab 08/28/13 0658 08/29/13 0746 08/31/13 0410  NA 133* 131* 131*  K 3.8 4.1 4.3  CL 97 99 98  CO2 23 23 23   BUN 22 14 14   CREATININE 0.77 0.68 0.57  CALCIUM 8.2* 7.7* 7.9*  GLUCOSE 128* 124* 109*    CBG (last  3)  No results found for this basename: GLUCAP,  in the last 72 hours  Scheduled Meds: . amoxicillin-clavulanate  1 tablet Oral Q12H  . aspirin EC  325 mg Oral BID PC  . docusate sodium  100 mg Oral BID  . feeding supplement (ENSURE COMPLETE)  237 mL Oral BID BM  . feeding supplement (RESOURCE BREEZE)  1 Container Oral TID BM  . ferrous sulfate  325 mg Oral Q breakfast  . phenazopyridine  100 mg Oral TID WC  . pilocarpine  1 drop Both Eyes QID  . ramipril  10 mg Oral Daily  . senna  2 tablet Oral QHS  . tamsulosin  0.4 mg Oral QPC supper  . timolol  1 drop Both Eyes Daily    Continuous Infusions: . sodium chloride Stopped (08/31/13 0257)    Past Medical History  Diagnosis Date  . Hypertension   . Glaucoma   . Osteoporosis   . Hyperlipidemia   . SIADH (syndrome of inappropriate ADH production)     Past Surgical History  Procedure Laterality Date  . Cataract extraction, bilateral    . Transurethral resection of prostate    . Hip arthroplasty Left 08/26/2013    Procedure: ARTHROPLASTY BIPOLAR HIP;  Surgeon: Harvie Junior, MD;  Location: [facility];  Service: Orthopedics;  Laterality: Left;    Marlane Mingle, Dietetic Intern Pager: [phone]  I agree with above documentation.   Levon Hedger MS, RD, LDN [phone] Pager [phone] After Hours Pager

## 2013-08-31 NOTE — Discharge Summary (Addendum)
Physician Discharge Summary  Todd Whitaker WUJ:[id number] DOB: [birth year] DOA: 08/26/2013  PCP: Mickie Hillier, MD  Admit date: 08/26/2013 Discharge date: 08/31/2013  Recommendations for Outpatient Follow-up:  1. Follow up with orthopedics in 2 weeks, Dr. Luiz Blare.   2. Ongoing PT/OT 3. Foley catheter to remain in place until follow up with urology  4. Repeat BMP and CBC in 1-2 weeks to check sodium level and anemia 5. Assistance with meals, encourage drinking.  Please have drinks available at bedside constantly.   6. Augmentin through 3/5, then stop. 7. Aspirin 325mg  BID through 3/25, then change to 325mg  once daily 8. [facility] within 1-2 weeks for urinary retention  Discharge Diagnoses:  Principal Problem:   Subcapital fracture of left hip Active Problems:   HTN (hypertension)   Abnormal CT of brain   Closed left hip fracture   Acute blood loss anemia   Thrombocytopenia, unspecified   Hyponatremia   Postoperative fever   Aspiration pneumonia   Dysphagia, unspecified(787.20)   Moderate protein-calorie malnutrition   Hematuria   Protein-calorie malnutrition, severe   Discharge Condition: stable, improved  Diet recommendation: dysphagia 3 with thin liquids   Dysphagia 2 (Fine chop);Thin liquid  Liquid Administration via: Cup  Medication Administration: Whole meds with puree  Supervision: Patient able to self feed  Compensations: Slow rate;Small sips/bites;Check for pocketing  Postural Changes and/or Swallow Maneuvers: Seated upright 90 degrees;Upright 30-60 min after meal    Wt Readings from Last 3 Encounters:  08/26/13 62.596 kg (138 lb)  08/26/13 62.596 kg (138 lb)  08/26/13 62.596 kg (138 lb)    History of present illness:  Patient is an 80 year old white man with history only significant for hypertension, who presents to the hospital today after a mechanical fall in his independent living facility today. He states he was in the shower and slipped  hitting his left hip, because of immediate pain he was transferred to the emergency department where a hip x-ray demonstrates evidence for a subcapital left hip fracture. A CT scan of the head was also obtained which showed a hypodensity in the left cerebellar hemisphere which may reflect ischemic change versus technical in nature. Repeat CT head is recommended. Dr. Luiz Blare, orthopedics has been consulted, with plans for surgical intervention later today. Hospitalist admission was requested.  Hospital Course:   Subcapital fracture of the left hip s/p left hip arthroplasty on 2/25 by Dr. Luiz Blare.   -  Weight bearing as tolerated -  Continue aspirin twice daily for one month, through 3/25, then resume ASA 325 mg daily -  Continue PT/OT  -  To SNF for rehabiliation  Aspiration pneumonia with fever and cough, afebrile.  He was started on zosyn and quickly transitioned to augmentin, which he should continue for 7 days.  He was evaluated by speech therapy who recommended MBS which was completed.  He is at risk for ongoing aspiration.  Continue Dysphagia 3 with thin liquids and extra sauces.    Hypertension, blood pressure within normal limits.  Continue ACE inhibitor   Abnormal CT scan of the brain:  Initial head CT demonstrated no evidence of intracranial hemorrhage, however, there was a subtle area of hypodensity in the left cerebellar hemisphere which was concerning for ischemic change.  Repeat CT of the head about 24 hours later was of poor quality and was not able to exclude ischemic change.  Most likely, however, the finding was due to technical difficulties from movement during the scan.  Patient has had some  TIA-like episodes in the past.  Recommend continuing aspirin unless contraindication arises.  Follow up with primary care doctor for risk factor modification.    Dementia Defer work up for reversible causes to PCP.  Recommend haldol as needed before bed for sundowning.    Hx of SIADH and sodium  trending down slightly.  Sodium stable at 131.  Recommend encouraging oral intake, supervision with meals, constant access to fluids.  Repeat BMP in 1 week.    Acute urinary retention:  Had foley discontinued post-operatively, but could not void.  Coude catheter placed and he was started on flomax.  He developed some hematuria after replacement.  After 24-48 hours, his catheter was removed again, however, he again was unable to void, so catheter has been replaced.  His hematuria has almost completely cleared.  He will need follow up with urology in 1-2 weeks for voiding trial and examination.    Acute blood loss anemia, started iron supplementation.  Repeat CBC in 1-2 weeks.    Mild thrombocytopenia, acute phase reactant and possibly some consumption due to recent surgery  - Repeat CBC in AM  Moderate protein calorie malnutrition: Normally has good appetite, but poor since surgery.  He was seen by nutrition who recommended supplements and (other than texture) unrestricted diet.     Consultants:  Dr. Luiz Blare, orthopedics Procedures:  Left hip hemiarthroplasty 2/25 Antibiotics:  Zosyn 2/27 >> 2/28  Augmentin 2/28 >>   Discharge Exam: Filed Vitals:   08/31/13 1008  BP: 132/72  Pulse:   Temp:   Resp:    Filed Vitals:   08/30/13 0611 08/30/13 1334 08/30/13 2020 08/31/13 1008  BP: 134/69 136/69 147/96 132/72  Pulse: 90 87 85   Temp: 98.2 F (36.8 C) 98.9 F (37.2 C) 98.7 F (37.1 C)   TempSrc: Oral Oral Oral   Resp: 18 18 20    Height:      Weight:      SpO2: 93% 94% 96%     General: CM, No acute distress, confused HEENT: NCAT, MMM  Cardiovascular: RRR, nl S1, S2 no mrg, 2+ pulses, warm extremities  Respiratory:  Decreased rales at the right base, no wheezes or rhonchi, no increased WOB  Abdomen: NABS, soft, NT/ND MSK: Normal tone and bulk, no LEE. Knee braced. Bandage left lateral hip c/d/i. + swelling and bruising.  Neuro: Grossly intact   Discharge Instructions       Discharge Orders   Future Orders Complete By Expires   Call MD for:  difficulty breathing, headache or visual disturbances  As directed    Call MD for:  extreme fatigue  As directed    Call MD for:  hives  As directed    Call MD for:  persistant dizziness or light-headedness  As directed    Call MD for:  persistant nausea and vomiting  As directed    Call MD for:  redness, tenderness, or signs of infection (pain, swelling, redness, odor or green/yellow discharge around incision site)  As directed    Call MD for:  severe uncontrolled pain  As directed    Call MD for:  temperature >100.4  As directed    Increase activity slowly  As directed    Weight bearing as tolerated  As directed    Scheduling Instructions:     Posterior hip precautions.   Questions:     Laterality:  left   Extremity:  Lower       Medication List    STOP  taking these medications       aspirin 81 MG tablet  Replaced by:  aspirin EC 325 MG tablet      TAKE these medications       acetaminophen 500 MG tablet  Commonly known as:  TYLENOL  Take 500-1,000 mg by mouth every 6 (six) hours as needed for mild pain.     amoxicillin-clavulanate 875-125 MG per tablet  Commonly known as:  AUGMENTIN  Take 1 tablet by mouth every 12 (twelve) hours.     aspirin EC 325 MG tablet  Take 1 tablet (325 mg total) by mouth 2 (two) times daily after a meal.     bisacodyl 10 MG suppository  Commonly known as:  DULCOLAX  Place 1 suppository (10 mg total) rectally daily as needed for mild constipation or moderate constipation.     DSS 100 MG Caps  Take 100 mg by mouth 2 (two) times daily.     feeding supplement (ENSURE COMPLETE) Liqd  Take 237 mLs by mouth 2 (two) times daily between meals.     feeding supplement (RESOURCE BREEZE) Liqd  Take 1 Container by mouth 3 (three) times daily between meals.     ferrous sulfate 325 (65 FE) MG tablet  Take 1 tablet (325 mg total) by mouth daily with breakfast.     haloperidol 1  MG tablet  Commonly known as:  HALDOL  Take 1 tablet (1 mg total) by mouth at bedtime as needed for agitation.     HYDROcodone-acetaminophen 5-325 MG per tablet  Commonly known as:  NORCO  Take 1 tablet by mouth every 8 (eight) hours as needed for moderate pain.     ICAPS Tabs  Take 1 tablet by mouth daily.     pilocarpine 4 % ophthalmic solution  Commonly known as:  PILOCAR  Place 1 drop into both eyes 4 (four) times daily.     ramipril 10 MG capsule  Commonly known as:  ALTACE  Take 10 mg by mouth daily.     tamsulosin 0.4 MG Caps capsule  Commonly known as:  FLOMAX  Take 1 capsule (0.4 mg total) by mouth daily after supper.     timolol 0.5 % ophthalmic solution  Commonly known as:  BETIMOL  Place 1 drop into both eyes daily.     Vitamin D 2000 UNITS tablet  Take 2,000 Units by mouth daily.       Follow-up Information   Follow up with GRAVES,JOHN L, MD. Schedule an appointment as soon as possible for a visit in 2 weeks.   Specialty:  Orthopedic Surgery   Contact information:   [address] [place] [address] [phone]       Follow up with GRAVES,JOHN L, MD In 2 weeks.   Specialty:  Orthopedic Surgery   Contact information:   [address] [place] [address] [phone]       Follow up with Mickie Hillier, MD. Schedule an appointment as soon as possible for a visit in 2 weeks.   Specialty:  Family Medicine   Contact information:   [address] [place] [address] [phone]       Follow up with [facility]. Schedule an appointment as soon as possible for a visit in 1 week.   Contact information:   [address] [place] [address] [place] [address] [phone]       The results of significant diagnostics from this hospitalization (including imaging, microbiology, ancillary and laboratory) are  listed below for reference.    Significant Diagnostic Studies: Dg Hip Complete Left  08/26/2013   CLINICAL DATA:  Fall.  EXAM:  LEFT HIP - COMPLETE 2+ VIEW  COMPARISON:  None.  FINDINGS: Angulated subcapital femoral neck fracture is present on the left. Diffuse osteopenia and degenerative change present. Calcifications in pelvis consistent with phleboliths.  IMPRESSION: Angulated left subcapital femoral neck fracture.   Electronically Signed   By: Maisie Fus  Register   On: 08/26/2013 10:22   Ct Head Wo Contrast  08/27/2013   CLINICAL DATA:  Followup abnormal head CT.  EXAM: CT HEAD WITHOUT CONTRAST  TECHNIQUE: Contiguous axial images were obtained from the base of the skull through the vertex without intravenous contrast.  COMPARISON:  08/26/2013 and 01/29/2011  FINDINGS: Motion again degree hazy images. This particularly limits evaluation of the inferior posterior fossa. The reported subtle asymmetric hypoattenuation in the left cerebellar hemispheres is not demonstrated on the current exam, but this area is poorly visualized.  The ventricles are normal in configuration. There is ventricular and sulcal enlargement reflecting moderate atrophy. No hydrocephalus.  No parenchymal masses or mass effect. Patchy areas of white matter hypoattenuation are noted most consistent with mild chronic microvascular ischemic change.  There is no evidence of a recent cortical infarct.  There are no extra-axial masses or abnormal fluid collections.  There is no intracranial hemorrhage.  Visualized sinuses and mastoid air cells are clear.  IMPRESSION: 1. Study is limited by motion, which particularly affects evaluation of the posterior fossa. The questionable asymmetric hypoattenuation in the left cerebellar hemisphere noted on the previous exam is not well evaluated currently. 2. Allowing for the motion limitation, there is no evidence of acute intracranial pathology. 3. Moderate atrophy and mild chronic microvascular ischemic change.   Electronically Signed   By: Amie Portland M.D.   On: 08/27/2013 15:35   Ct Head Wo Contrast  08/26/2013   CLINICAL DATA:   Mental status change, status post fall.  EXAM: CT HEAD WITHOUT CONTRAST  TECHNIQUE: Contiguous axial images were obtained from the base of the skull through the vertex without intravenous contrast.  COMPARISON:  CT HEAD WO/W CM dated 01/29/2011  FINDINGS: There is mild diffuse cerebral and cerebellar atrophy with compensatory ventriculomegaly. There is no evidence of an acute intracranial hemorrhage. There is hypodensity in the left cerebellar hemisphere that is asymmetric with that on the right. This is demonstrated on images 6-8. This may reflect ischemic change. The brainstem exhibits no acute abnormality. There is decreased density in the deep white matter of both cerebral hemispheres consistent with chronic small vessel ischemic type change.  At bone window settings the observed portions of the paranasal sinuses and mastoid air cells are clear. There is no evidence of an acute skull fracture.  IMPRESSION: 1. There is no evidence of an acute intracranial hemorrhage. 2. There is subtle hypodensity in the left cerebellar hemisphere asymmetric with the same region on the right. This may reflect evolving ischemic change but it may be technical in nature. 3. There is no evidence of an acute ischemic event involving the cerebrum. There is decreased density bilaterally in the deep white matter of the cerebrum consistent with chronic small vessel ischemic type change. Follow-up CT scanning or MRI is recommended if the patient's symptoms persist.   Electronically Signed   By: David  [place]   On: 08/26/2013 11:28   Dg Pelvis Portable  08/26/2013   CLINICAL DATA:  Postop left hip surgery.  EXAM: PORTABLE PELVIS  1-2 VIEWS  COMPARISON:  None.  FINDINGS: New left hip prosthesis is well-seated and aligned. No evidence of an operative complication.   Electronically Signed   By: Amie Portland M.D.   On: 08/26/2013 21:32   Dg Chest Port 1 View  08/27/2013   CLINICAL DATA:  New onset of fever.  Recent hip surgery.  EXAM:  PORTABLE CHEST - 1 VIEW  COMPARISON:  08/26/2013.  FINDINGS: New right base opacity silhouettes the elevated right hemidiaphragm, concerning for pneumonia. No pneumothorax. No significant volume loss. Stable cardiomegaly and vascular calcification. Osteopenia.  IMPRESSION: New right base infiltrate, suspicious for pneumonia.   Electronically Signed   By: Davonna Belling M.D.   On: 08/27/2013 19:55   Dg Chest Port 1 View  08/26/2013   CLINICAL DATA:  Left hip fracture  EXAM: PORTABLE CHEST - 1 VIEW  COMPARISON:  None.  FINDINGS: Study is limited by poor inspiration. Mild elevation of the right hemidiaphragm with right basilar atelectasis. No acute infiltrate or pulmonary edema. Atherosclerotic calcifications of thoracic spine.  IMPRESSION: No infiltrate or pulmonary edema. Mild elevation of the right hemidiaphragm with right basilar atelectasis   Electronically Signed   By: Natasha Mead M.D.   On: 08/26/2013 10:58   Dg Hip Portable 1 View Left  08/26/2013   CLINICAL DATA:  Postop left hip arthroplasty.  EXAM: PORTABLE LEFT HIP - 1 VIEW  COMPARISON:  None.  FINDINGS: New left hip prosthesis is well-seated and aligned There is no acute fracture or evidence of an operative complication.   Electronically Signed   By: Amie Portland M.D.   On: 08/26/2013 21:33   Dg Swallowing Func-speech Pathology  08/28/2013   Chales Abrahams, CCC-SLP     08/28/2013  4:14 PM    Objective Swallowing Evaluation: Modified Barium Swallowing  Study  Patient Details  Name: Todd Whitaker MRN: [id number] Date of Birth: 10/20/1927  Today's Date: 08/28/2013 Time: 4696-2952 SLP Time Calculation (min): 28 min  Past Medical History:  Past Medical History  Diagnosis Date  . Hypertension   . Glaucoma   . Osteoporosis   . Hyperlipidemia   . SIADH (syndrome of inappropriate ADH production)    Past Surgical History:  Past Surgical History  Procedure Laterality Date  . Cataract extraction, bilateral    . Transurethral resection of prostate    . Hip  arthroplasty Left 08/26/2013    Procedure: ARTHROPLASTY BIPOLAR HIP;  Surgeon: Harvie Junior,  MD;  Location: [facility];  Service: Orthopedics;  Laterality: Left;   HPI:  80 yo male adm to [facility] with left hip fx, s/p left orthoplasty,  post op fever with CXR indicating right lower lobe pna.  CT head  showed ? left cerebellar hypoattentuation.  Pt has h/o dementia  *son reports progressive decline in the last few months and HTN.   Son stated he eats with pt on weekends and he has not noticed pt  having dysphagia prior to this admit.       Assessment / Plan / Recommendation Clinical Impression  Dysphagia Diagnosis: Moderate oral phase dysphagia;Mild cervical  esophageal phase dysphagia;Moderate pharyngeal phase dysphagia Clinical impression: Moderate oropharyngeal and mild cervical  esophageal dysphagia present with sensorimotor deficits.  Pt also  appears with significant lordosis (radiologist not present to  confirm) that prevents adequately epiglottic deflection allowing  significant stasis at vallecular space more than pyriform sinus  region and likely decreased UES clearance as well.  Mild amount  of aspiration with  cough response noted with thin when attempting  head turn, right/left and chin tuck.   Neutral position appeared  most effective in swallowing without overt aspiration noted.   Oral propulsion impaired evident by oral tongue base residuals  without pt sensation.  Following solids with liquds helpful to  decrease oropharyngeal stasis.  Trace penetration of thin cleared  with cued throat clear/cough.  Please note, pt was conducting  throat clear during MBS without barium always visualized in  larynx/trachea.  Recommend soft diet/ground meats with thin  liquids with strict precautions to mitigate aspiration risk.  Recommend avoiding particulate foods due to concerns for  aspiration of small particles.  Suspect component of chronic  deficits given appearance of lordosis for which pt has managed  prior to this  event.  Son Viviann SpareSteven present and son/pt educated to  findings and recommendations using monitor/verbal/diagram for  comprehension.    Recommend follow up SLP to maximize pt's  swallow rehabiliation with goal to return to premorbid level.      Treatment Recommendation  Therapy as outlined in treatment plan below    Diet Recommendation Dysphagia 3 (Mechanical Soft);Thin liquid  (ground meat, extra gravy/sauces)   Liquid Administration via: Cup;Straw Medication Administration: Crushed with puree Supervision: Patient able to self feed Compensations: Slow rate;Small sips/bites;Multiple dry swallows  after each bite/sip;Follow solids with liquid (start meal with  liquids, prefer water) Postural Changes and/or Swallow Maneuvers: Seated upright 90  degrees;Upright 30-60 min after meal    Other  Recommendations Recommended Consults: MBS Oral Care Recommendations: Oral care before and after PO   Follow Up Recommendations  Skilled Nursing facility    Frequency and Duration min 2x/week  2 weeks   Pertinent Vitals/Pain Today afebrile, decreased   SLP Swallow Goals     General Date of Onset: 08/28/13 HPI: 80 [facility], s/p left  orthoplasty, post op fever with CXR indicating right lower lobe  pna.  CT head showed ? left cerebellar hypoattentuation.  Pt has  h/o dementia *son reports progressive decline in the last few  months and HTN.  Son stated he eats with pt on weekends and he  has not noticed pt having dysphagia prior to this admit.   Type of Study: Modified Barium Swallowing Study Reason for Referral: Objectively evaluate swallowing function Diet Prior to this Study: Dysphagia 2 (chopped);Thin liquids Temperature Spikes Noted:  (yesterday febrile, today afebrile) Respiratory Status: Room air History of Recent Intubation: Yes Length of Intubations (days):  (for surgery only) Behavior/Cognition: Alert;Cooperative;Decreased sustained  attention;Doesn't follow directions Oral Cavity - Dentition: Adequate  natural dentition Oral Motor / Sensory Function: Within functional limits Self-Feeding Abilities: Able to feed self Patient Positioning: Upright in chair Baseline Vocal Quality: Low vocal intensity (improved today  compared to yesterday) Volitional Cough: Strong Volitional Swallow: Able to elicit Anatomy:  (pt with appearance of significant lordosis of cervical  spine, radiologist not present to confirm) Pharyngeal Secretions: Standing secretions in (comment) (pyriform  sinus)    Reason for Referral Objectively evaluate swallowing function   Oral Phase Oral Preparation/Oral Phase Oral Phase: Impaired Oral - Nectar Oral - Nectar Teaspoon: Weak lingual manipulation;Reduced  posterior propulsion Oral - Nectar Cup: Reduced posterior propulsion;Weak lingual  manipulation Oral - Thin Oral - Thin Teaspoon: Reduced posterior propulsion;Weak lingual  manipulation Oral - Thin Cup: Reduced posterior propulsion;Weak lingual  manipulation Oral - Thin Straw: Weak lingual manipulation;Reduced posterior  propulsion Oral - Solids Oral - Puree: Reduced posterior  propulsion;Weak lingual  manipulation;Lingual/palatal residue Oral - Regular: Reduced posterior propulsion;Weak lingual  manipulation;Lingual/palatal residue Oral Phase - Comment Oral Phase - Comment: base of tongue stasis noted with thicker  consistencies more than thin without pt awareness   Pharyngeal Phase Pharyngeal Phase Pharyngeal Phase: Impaired Pharyngeal - Nectar Pharyngeal - Nectar Teaspoon: Reduced epiglottic  inversion;Reduced tongue base retraction;Pharyngeal residue -  valleculae;Pharyngeal residue - pyriform sinuses Pharyngeal - Nectar Cup: Reduced epiglottic inversion;Reduced  tongue base retraction;Pharyngeal residue - valleculae;Pharyngeal  residue - pyriform sinuses Pharyngeal - Thin Pharyngeal - Thin Teaspoon: Pharyngeal residue - pyriform  sinuses;Pharyngeal residue - valleculae;Reduced epiglottic  inversion;Reduced tongue base retraction Pharyngeal -  Thin Cup: Reduced epiglottic inversion;Reduced  tongue base retraction;Pharyngeal residue - valleculae;Pharyngeal  residue - pyriform sinuses;Moderate aspiration Pharyngeal - Thin Straw: Reduced epiglottic inversion;Reduced  tongue base retraction;Moderate aspiration Pharyngeal - Solids Pharyngeal - Puree: Reduced pharyngeal peristalsis;Reduced  epiglottic inversion;Pharyngeal residue - valleculae Pharyngeal - Regular: Reduced epiglottic inversion;Reduced tongue  base retraction;Pharyngeal residue - valleculae Pharyngeal Phase - Comment Pharyngeal Comment: attempts at head turn right or left and chin  tuck were not effective to prevent laryngeal penetration, head  neutral best position for pt, pt does not sense pharyngeal stasis  and did not "hock" adequately to clear, following solids with  liquids and conducting mulitple swallows effective to decrease  stasis, intermittent throat clear/cough cleared trace  penetrates/aspirates  Cervical Esophageal Phase    GO    Cervical Esophageal Phase Cervical Esophageal Phase: Impaired Cervical Esophageal Phase - Nectar Nectar Teaspoon: Reduced cricopharyngeal relaxation Nectar Cup: Reduced cricopharyngeal relaxation Cervical Esophageal Phase - Thin Thin Teaspoon: Reduced cricopharyngeal relaxation Thin Cup: Reduced cricopharyngeal relaxation Thin Straw: Reduced cricopharyngeal relaxation Cervical Esophageal Phase - Solids Puree: Reduced cricopharyngeal relaxation Regular: Reduced cricopharyngeal relaxation Cervical Esophageal Phase - Comment Cervical Esophageal Comment: appearance of secretions mixed with  barium in pyriform sinus noted, suspect lordosis impacting  pharyngeal clearance into esophagus         Donavan Burnet, MS [facility] SLP [phone]     Microbiology: Recent Results (from the past 240 hour(s))  SURGICAL PCR SCREEN     Status: Abnormal   Collection Time    08/26/13  4:15 PM      Result Value Ref Range Status   MRSA, PCR INVALID RESULTS, SPECIMEN SENT FOR  CULTURE (*) NEGATIVE Final   Comment: Chinita Pester RN [phone] 08/26/13 A [place]ARRO   Staphylococcus aureus INVALID RESULTS, SPECIMEN SENT FOR CULTURE (*) NEGATIVE Final   Comment:            The Xpert SA Assay (FDA     approved for NASAL specimens     in patients over 46 years of age),     is one component of     a comprehensive surveillance     program.  Test performance has     been validated by [employer] for patients greater     than or equal to 4 year old.     It is not intended     to diagnose infection nor to     guide or monitor treatment.  MRSA CULTURE     Status: None   Collection Time    08/26/13  4:15 PM      Result Value Ref Range Status   Specimen Description NOSE   Final   Special Requests NONE   Final   Culture     Final   Value: NO STAPHYLOCOCCUS AUREUS ISOLATED  Note: No MRSA Isolated     Performed at [facility]   Report Status 08/29/2013 FINAL   Final  SURGICAL PCR SCREEN     Status: Abnormal   Collection Time    08/27/13  3:06 AM      Result Value Ref Range Status   MRSA, PCR INVALID RESULTS, SPECIMEN SENT FOR CULTURE (*) NEGATIVE Final   Comment: INFORMED D. HOLCOMB RN AT 1610 ON 02.26.15 BY SHUEA   Staphylococcus aureus INVALID RESULTS, SPECIMEN SENT FOR CULTURE (*) NEGATIVE Final   Comment: INFORMED D. HOLCOMB RN AT 9604 ON 02.26.15 BY SHUEA                The Xpert SA Assay (FDA     approved for NASAL specimens     in patients over 50 years of age),     is one component of     a comprehensive surveillance     program.  Test performance has     been validated by [employer] for patients greater     than or equal to 57 year old.     It is not intended     to diagnose infection nor to     guide or monitor treatment.  MRSA CULTURE     Status: None   Collection Time    08/27/13  3:15 AM      Result Value Ref Range Status   Specimen Description NOSE   Final   Special Requests NONE   Final   Culture     Final   Value: NO  STAPHYLOCOCCUS AUREUS ISOLATED     Note: No MRSA Isolated     Performed at [facility]   Report Status 08/29/2013 FINAL   Final  URINE CULTURE     Status: None   Collection Time    08/27/13  3:18 PM      Result Value Ref Range Status   Specimen Description URINE, CATHETERIZED   Final   Special Requests Ancef IV Normal   Final   Culture  Setup Time     Final   Value: 08/27/2013 23:42     Performed at [facility] Count     Final   Value: NO GROWTH     Performed at [facility]   Culture     Final   Value: NO GROWTH     Performed at [facility]   Report Status 08/28/2013 FINAL   Final     Labs: Basic Metabolic Panel:  Recent Labs Lab 08/26/13 1100 08/27/13 0350 08/28/13 0658 08/29/13 0746 08/31/13 0410  NA 136* 133* 133* 131* 131*  K 4.0 4.1 3.8 4.1 4.3  CL 99 99 97 99 98  CO2 22 22 23 23 23   GLUCOSE 127* 159* 128* 124* 109*  BUN 21 14 22 14 14   CREATININE 0.71 0.73 0.77 0.68 0.57  CALCIUM 8.7 8.0* 8.2* 7.7* 7.9*   Liver Function Tests: No results found for this basename: AST, ALT, ALKPHOS, BILITOT, PROT, ALBUMIN,  in the last 168 hours No results found for this basename: LIPASE, AMYLASE,  in the last 168 hours No results found for this basename: AMMONIA,  in the last 168 hours CBC:  Recent Labs Lab 08/26/13 1100 08/27/13 0350 08/28/13 0658  WBC 6.9 9.5 10.0  NEUTROABS 5.9  --   --   HGB 13.4 12.2* 11.9*  HCT 40.1 36.5* 34.4*  MCV 94.1 94.3 92.0  PLT 149* 139* 139*   Cardiac Enzymes: No results found for this basename: CKTOTAL, CKMB, CKMBINDEX, TROPONINI,  in the last 168 hours BNP: BNP (last 3 results) No results found for this basename: PROBNP,  in the last 8760 hours CBG: No results found for this basename: GLUCAP,  in the last 168 hours  Time coordinating discharge: 45 minutes  Signed:  Arrietty Dercole  [facility] 08/31/2013, 12:20 PM

## 2013-08-31 NOTE — Progress Notes (Signed)
Subjective: 5 Days Post-Op Procedure(s) (LRB): ARTHROPLASTY BIPOLAR HIP (Left) Patient reports pain as mild.    Objective: Vital signs in last 24 hours: Temp:  [98.7 F (37.1 C)-98.9 F (37.2 C)] 98.7 F (37.1 C) (03/01 2020) Pulse Rate:  [85-87] 85 (03/01 2020) Resp:  [18-20] 20 (03/01 2020) BP: (136-147)/(69-96) 147/96 mmHg (03/01 2020) SpO2:  [94 %-96 %] 96 % (03/01 2020)  Intake/Output from previous day: 03/01 0701 - 03/02 0700 In: 581.3 [P.O.:360; I.V.:221.3] Out: 375 [Urine:375] Intake/Output this shift:    No results found for this basename: HGB,  in the last 72 hours No results found for this basename: WBC, RBC, HCT, PLT,  in the last 72 hours  Recent Labs  08/29/13 0746 08/31/13 0410  NA 131* 131*  K 4.1 4.3  CL 99 98  CO2 23 23  BUN 14 14  CREATININE 0.68 0.57  GLUCOSE 124* 109*  CALCIUM 7.7* 7.9*   No results found for this basename: LABPT, INR,  in the last 72 hours  Neurologically intact ABD soft Neurovascular intact Sensation intact distally Intact pulses distally Dorsiflexion/Plantar flexion intact No cellulitis present Compartment soft  Assessment/Plan: 5 Days Post-Op Procedure(s) (LRB): ARTHROPLASTY BIPOLAR HIP (Left) Advance diet Up with therapy Discharge to SNF F/u Sherene Plancarte 2 weeks  Virda Betters L 08/31/2013, 7:59 AM

## 2013-09-01 NOTE — Progress Notes (Signed)
Clinical Social Work Department CLINICAL SOCIAL WORK PLACEMENT NOTE 09/01/2013  Patient:  Todd Whitaker,Todd Whitaker  Account Number:  [id number] Admit date:  08/26/2013  Clinical Social Worker:  Cori RazorJAMIE Cinthya Bors, LCSW  Date/time:  08/27/2013 03:37 PM  Clinical Social Work is seeking post-discharge placement for this patient at the following level of care:   SKILLED NURSING   (*CSW will update this form in Epic as items are completed)     Patient/family provided with [facility] of Clinical [facility] Work's list of facilities offering this level of care within the geographic area requested by the patient (or if unable, by the patient's family).  08/27/2013  Patient/family informed of their freedom to choose among providers that offer the needed level of care, that participate in Medicare, Medicaid or managed care program needed by the patient, have an available bed and are willing to accept the patient.    Patient/family informed of [facility]' ownership interest in [facility], as well as of the fact that they are under no obligation to receive care at this facility.  PASARR submitted to EDS on 08/27/2013 PASARR number received from EDS on 08/27/2013  FL2 transmitted to all facilities in geographic area requested by pt/family on  08/27/2013 FL2 transmitted to all facilities within larger geographic area on   Patient informed that his/her managed care company has contracts with or will negotiate with  certain facilities, including the following:     Patient/family informed of bed offers received:  08/06/2013 [facility] Physician recommends and patient chooses bed at    [facility] on  08/31/2013 Patient to be transferred to facility by [facility]  The following physician request were entered in Epic:   Additional Comments:  Cori RazorJamie Liller Yohn LCSW [phone]

## 2014-07-15 ENCOUNTER — Encounter (HOSPITAL_COMMUNITY): Payer: Self-pay | Admitting: Orthopedic Surgery

## 2015-03-05 IMAGING — CR DG CHEST 1V PORT
1 series · 1 of 1 positions shown · non-contrast
Comparison: None.

CLINICAL DATA: Left hip fracture

EXAM:
PORTABLE CHEST - 1 VIEW

[AP]
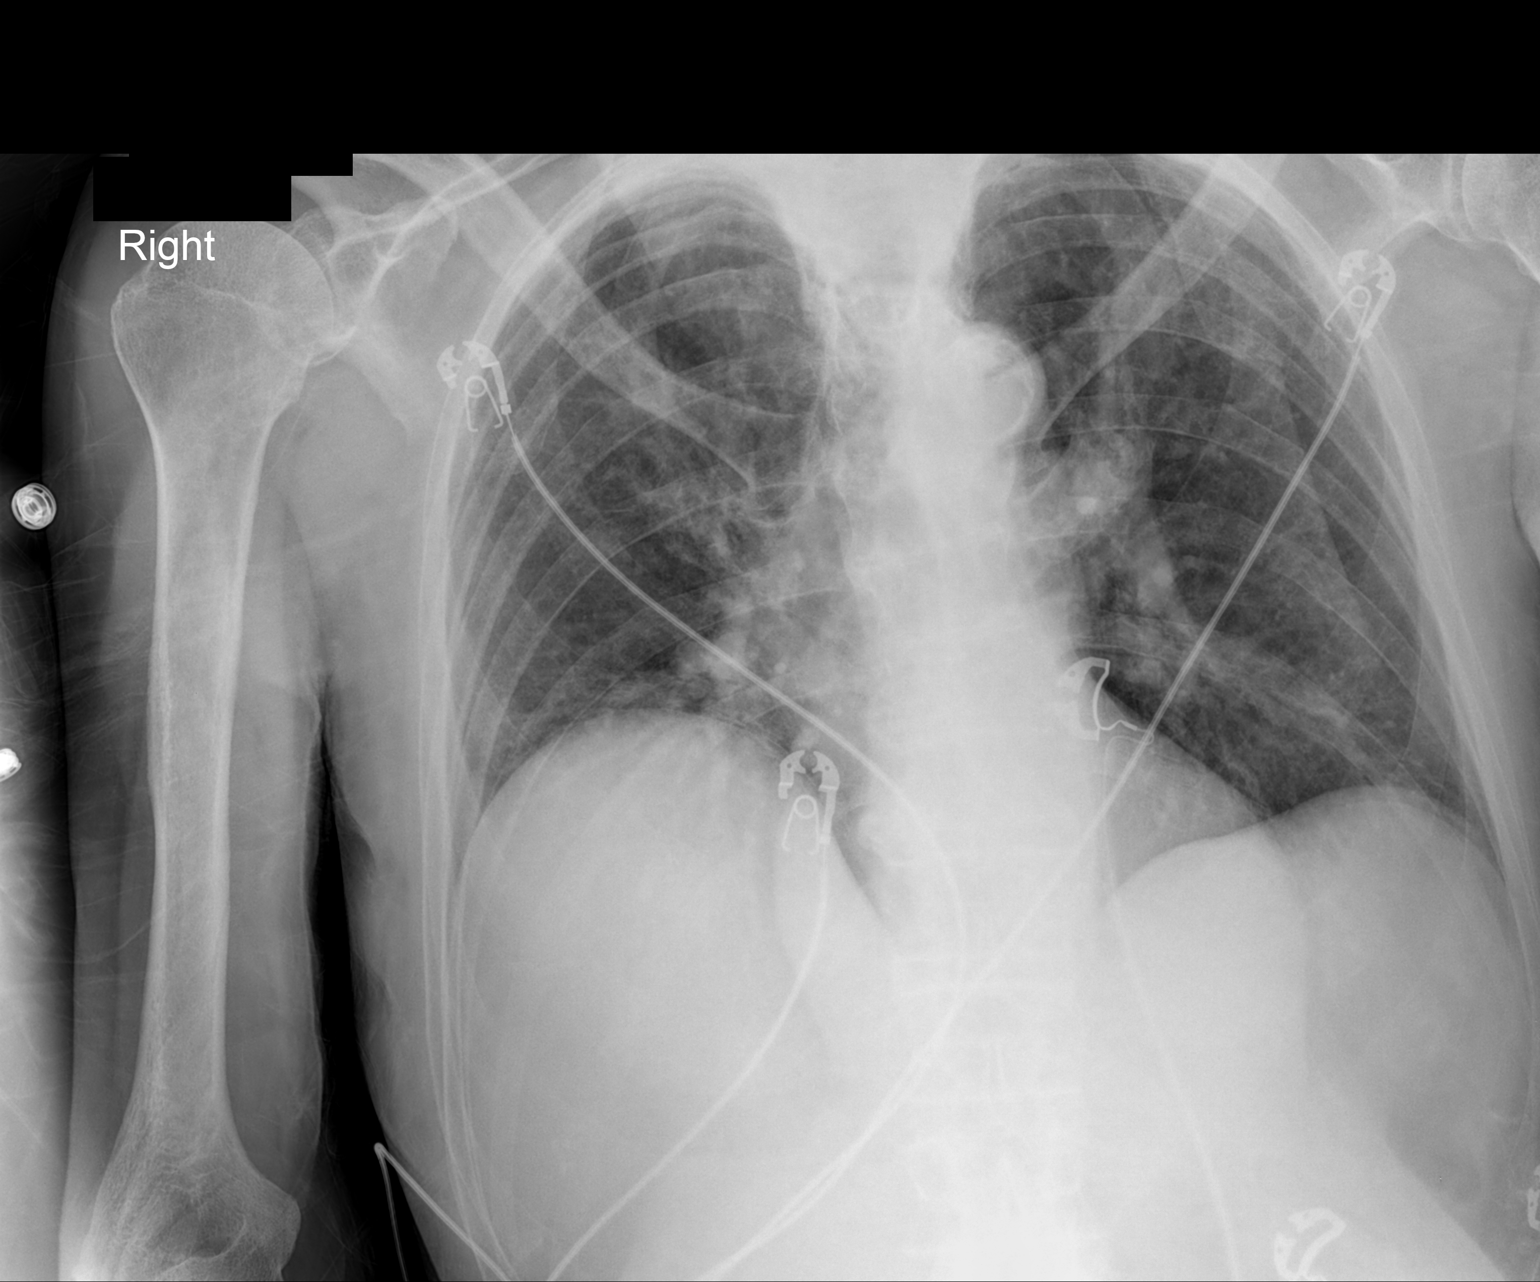

[1 of 1 positions shown; findings below may reference images not displayed]

FINDINGS: Study is limited by poor inspiration. Mild elevation of the right
hemidiaphragm with right basilar atelectasis. No acute infiltrate or
pulmonary edema. Atherosclerotic calcifications of thoracic spine.
IMPRESSION: No infiltrate or pulmonary edema. Mild elevation of the right
hemidiaphragm with right basilar atelectasis

## 2015-03-06 IMAGING — CT CT HEAD W/O CM
1 series · 15 of 30 positions shown, 19 images · non-contrast
Comparison: 08/26/2013 and 01/29/2011

CLINICAL DATA: Followup abnormal head CT.

EXAM:
CT HEAD WITHOUT CONTRAST
TECHNIQUE: Contiguous axial images were obtained from the base of the skull
through the vertex without intravenous contrast.

[Series 2: headseq 4.8 h45s · axial · 0.47mm/px · z∈[-662,-508]mm · 15 of 36 slices shown, 19 images]
[im 2/36  brain]
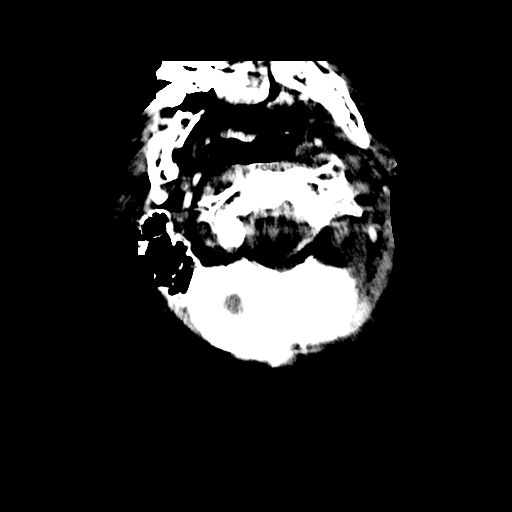
[im 2/36  bone]
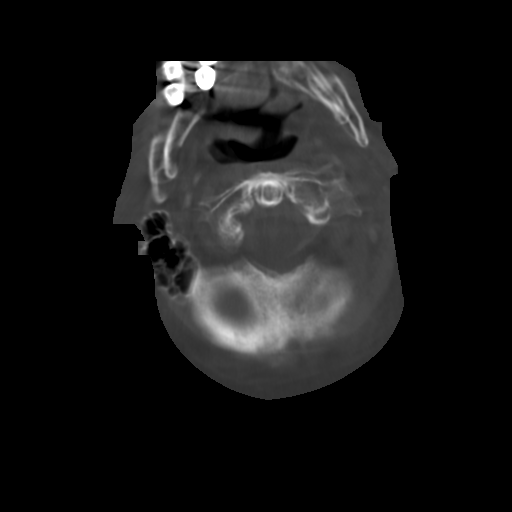
[im 4/36  brain]
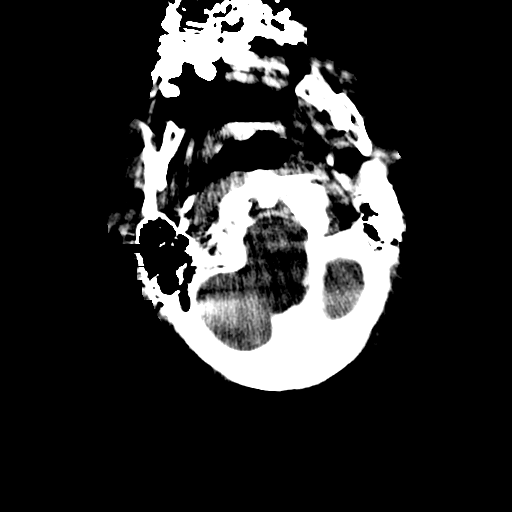
[im 7/36  brain]
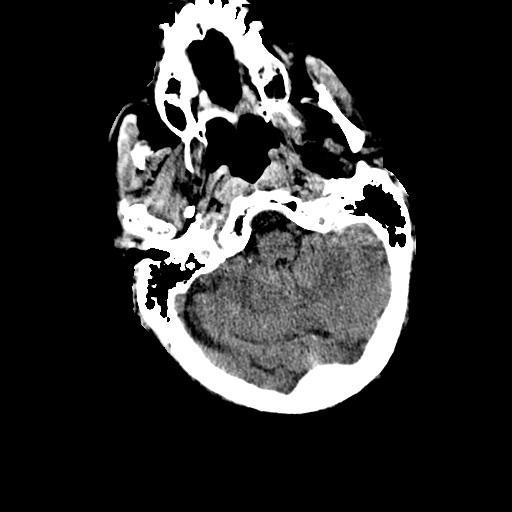
[im 9/36  brain]
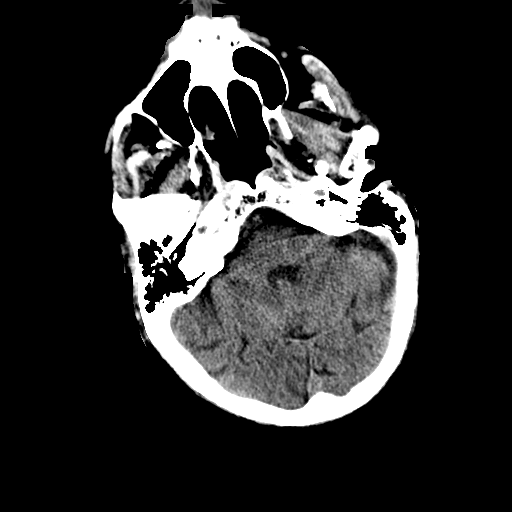
[im 11/36  brain]
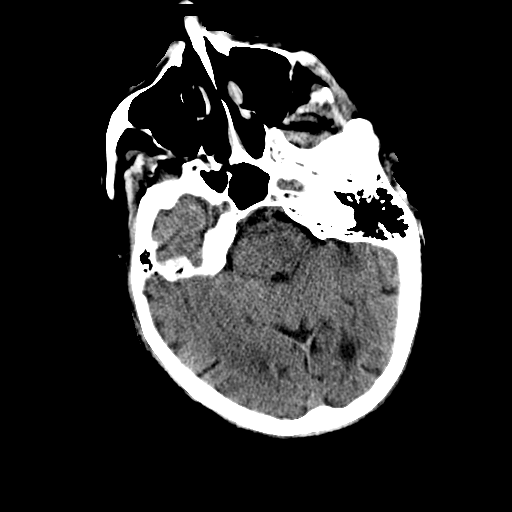
[im 11/36  bone]
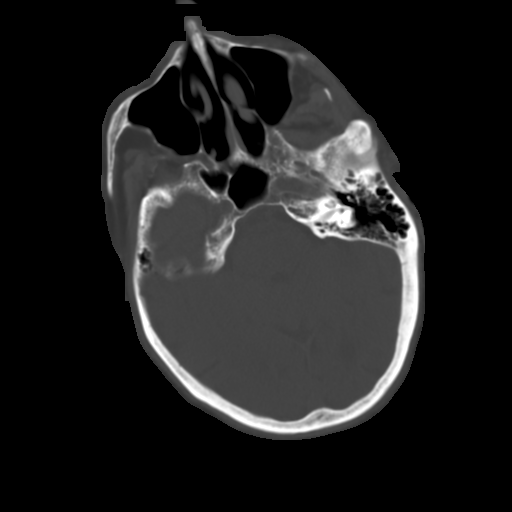
[im 14/36  brain]
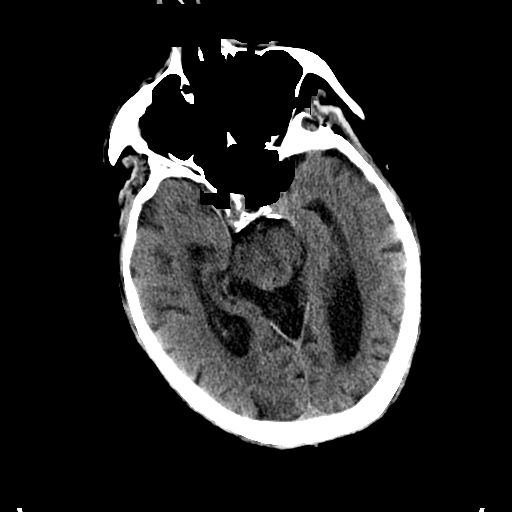
[im 16/36  brain]
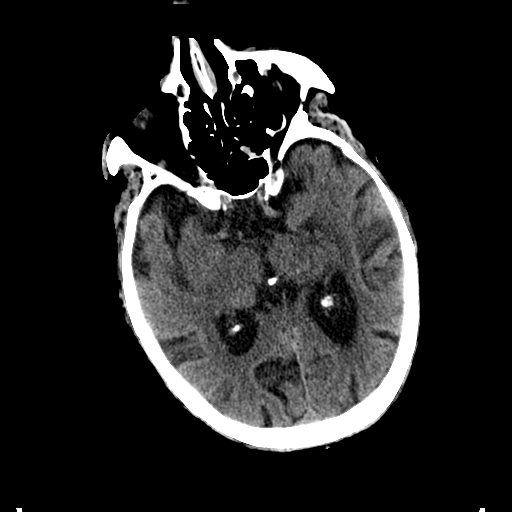
[im 19/36  brain]
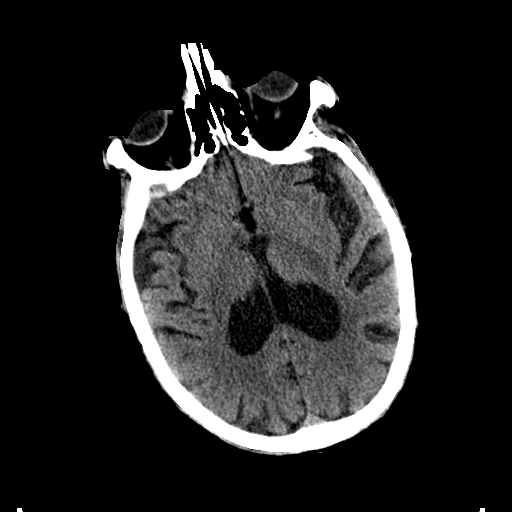
[im 20/36  brain]
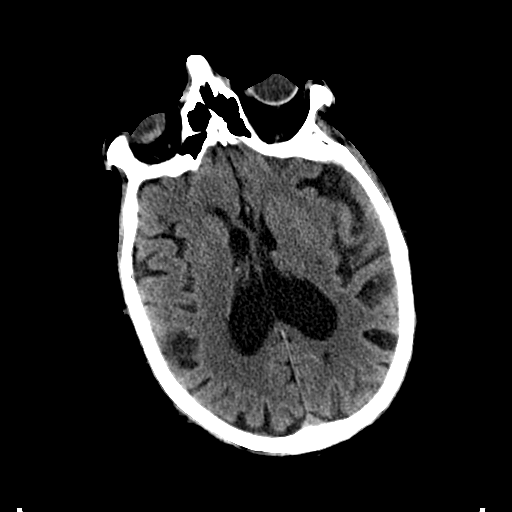
[im 20/36  bone]
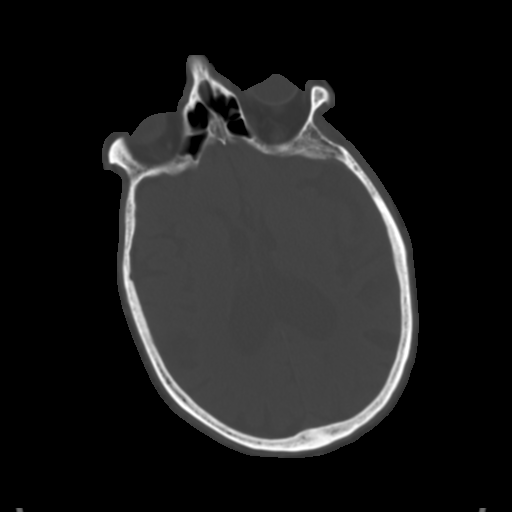
[im 22/36  brain]
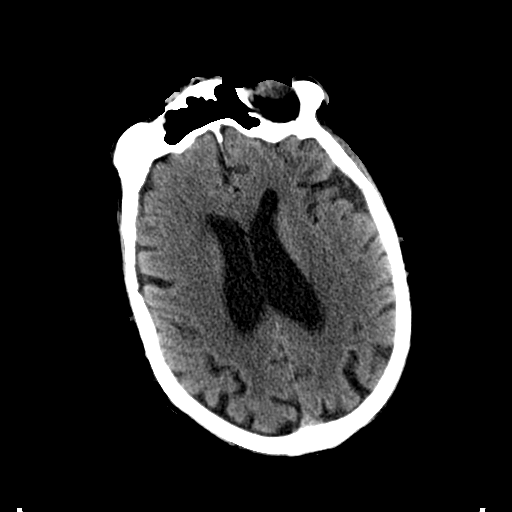
[im 25/36  brain]
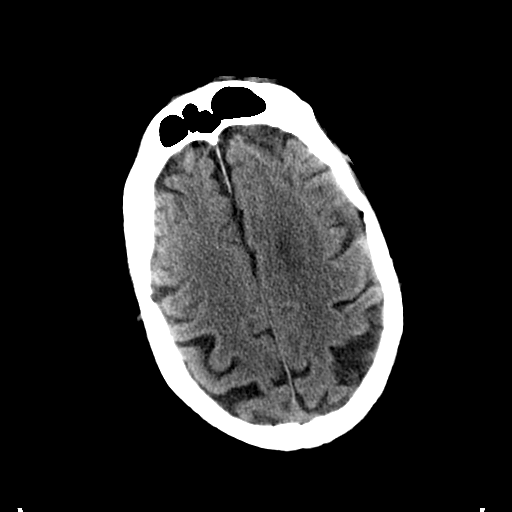
[im 27/36  brain]
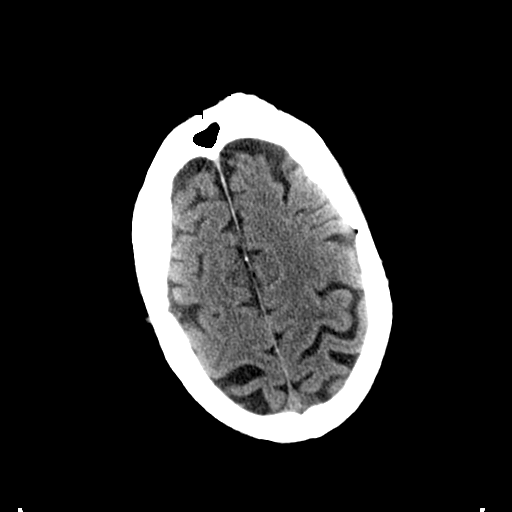
[im 29/36  brain]
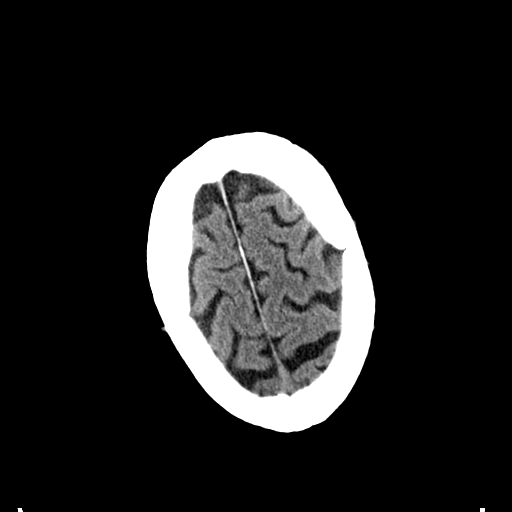
[im 29/36  bone]
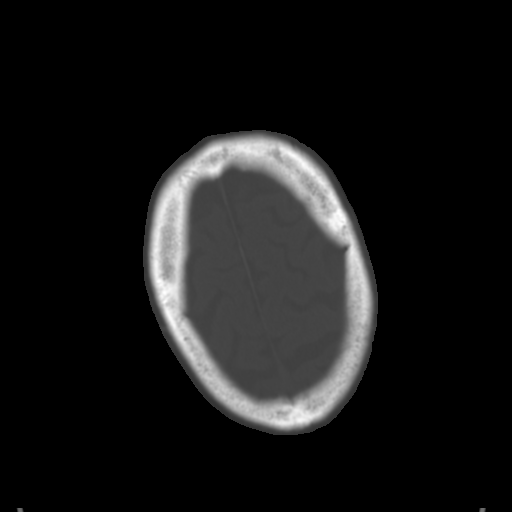
[im 32/36  brain]
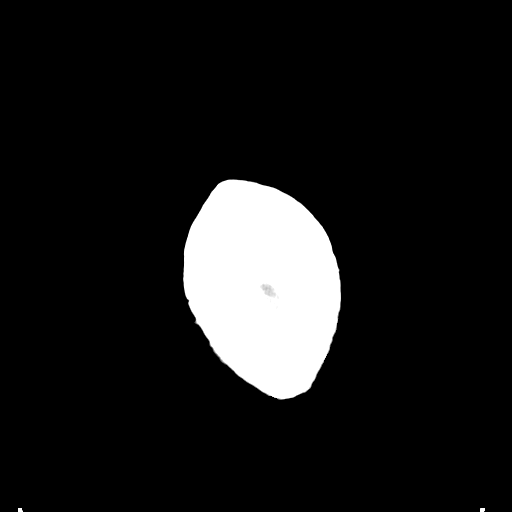
[im 34/36  brain]
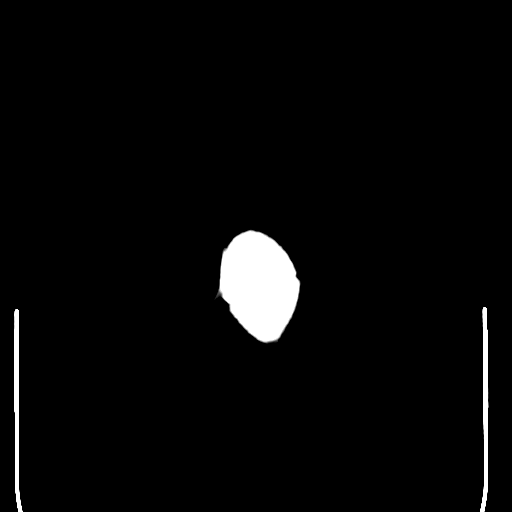

[15 of 30 positions shown; findings below may reference images not displayed]

FINDINGS: Motion again degree hazy images. This particularly limits evaluation
of the inferior posterior fossa. The reported subtle asymmetric
hypoattenuation in the left cerebellar hemispheres is not
demonstrated on the current exam, but this area is poorly
visualized.

The ventricles are normal in configuration. There is ventricular and
sulcal enlargement reflecting moderate atrophy. No hydrocephalus.

No parenchymal masses or mass effect. Patchy areas of white matter
hypoattenuation are noted most consistent with mild chronic
microvascular ischemic change.

There is no evidence of a recent cortical infarct.

There are no extra-axial masses or abnormal fluid collections.

There is no intracranial hemorrhage.

Visualized sinuses and mastoid air cells are clear.
IMPRESSION: 1. Study is limited by motion, which particularly affects evaluation
of the posterior fossa. The questionable asymmetric hypoattenuation
in the left cerebellar hemisphere noted on the previous exam is not
well evaluated currently.
2. Allowing for the motion limitation, there is no evidence of acute
intracranial pathology.
3. Moderate atrophy and mild chronic microvascular ischemic change.

## 2015-03-06 IMAGING — CR DG CHEST 1V PORT
1 series · 1 of 1 positions shown · non-contrast
Comparison: 08/26/2013.

CLINICAL DATA: New onset of fever.  Recent hip surgery.

EXAM:
PORTABLE CHEST - 1 VIEW

[AP]
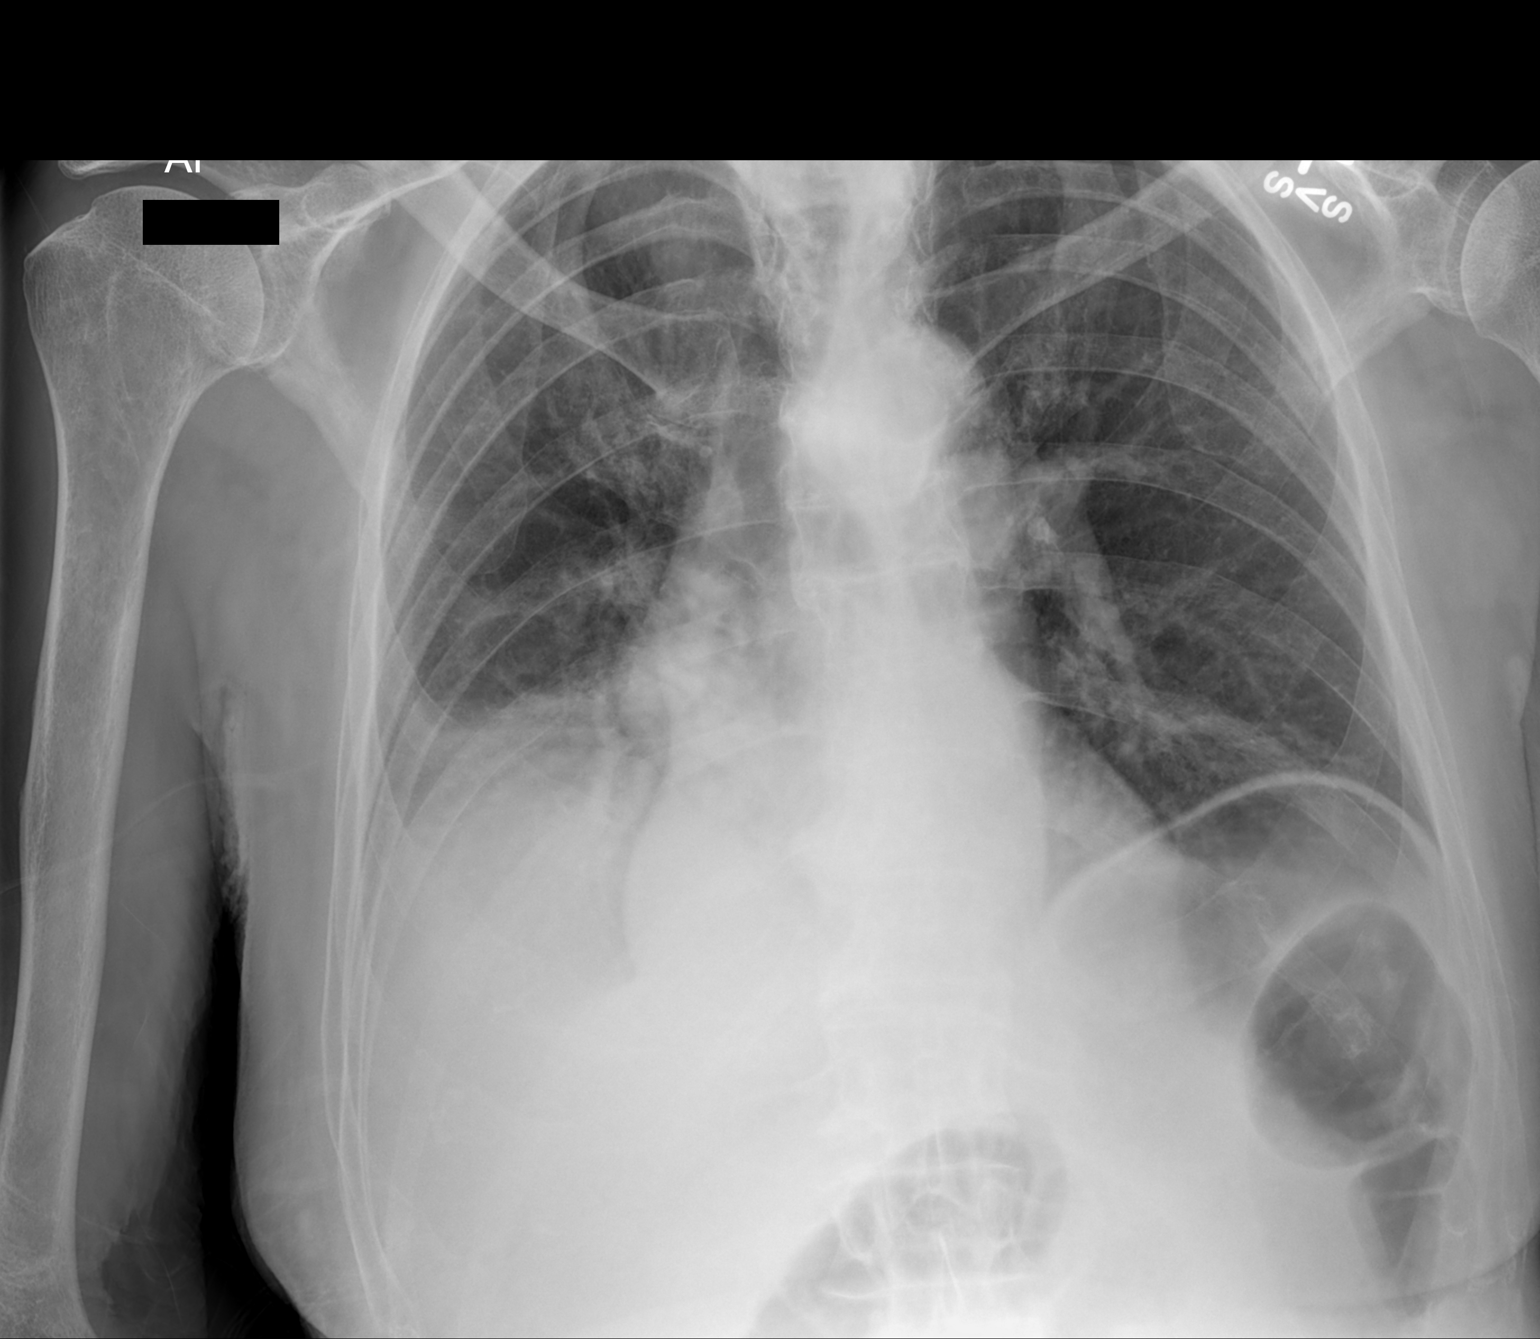

[1 of 1 positions shown; findings below may reference images not displayed]

FINDINGS: New right base opacity silhouettes the elevated right hemidiaphragm,
concerning for pneumonia. No pneumothorax. No significant volume
loss. Stable cardiomegaly and vascular calcification. Osteopenia.
IMPRESSION: New right base infiltrate, suspicious for pneumonia.

## 2015-08-17 DIAGNOSIS — H401122 Primary open-angle glaucoma, left eye, moderate stage: Secondary | ICD-10-CM | POA: Diagnosis not present

## 2015-08-17 DIAGNOSIS — H401111 Primary open-angle glaucoma, right eye, mild stage: Secondary | ICD-10-CM | POA: Diagnosis not present

## 2015-10-19 DIAGNOSIS — R296 Repeated falls: Secondary | ICD-10-CM | POA: Diagnosis not present

## 2015-10-19 DIAGNOSIS — R278 Other lack of coordination: Secondary | ICD-10-CM | POA: Diagnosis not present

## 2015-10-19 DIAGNOSIS — M6281 Muscle weakness (generalized): Secondary | ICD-10-CM | POA: Diagnosis not present

## 2015-10-19 DIAGNOSIS — R488 Other symbolic dysfunctions: Secondary | ICD-10-CM | POA: Diagnosis not present

## 2015-10-31 DIAGNOSIS — R488 Other symbolic dysfunctions: Secondary | ICD-10-CM | POA: Diagnosis not present

## 2015-10-31 DIAGNOSIS — R296 Repeated falls: Secondary | ICD-10-CM | POA: Diagnosis not present

## 2015-10-31 DIAGNOSIS — M6281 Muscle weakness (generalized): Secondary | ICD-10-CM | POA: Diagnosis not present

## 2015-10-31 DIAGNOSIS — R278 Other lack of coordination: Secondary | ICD-10-CM | POA: Diagnosis not present

## 2015-11-23 DIAGNOSIS — R278 Other lack of coordination: Secondary | ICD-10-CM | POA: Diagnosis not present

## 2015-11-23 DIAGNOSIS — R488 Other symbolic dysfunctions: Secondary | ICD-10-CM | POA: Diagnosis not present

## 2015-11-23 DIAGNOSIS — R296 Repeated falls: Secondary | ICD-10-CM | POA: Diagnosis not present

## 2015-11-23 DIAGNOSIS — M6281 Muscle weakness (generalized): Secondary | ICD-10-CM | POA: Diagnosis not present

## 2015-12-01 DIAGNOSIS — M6281 Muscle weakness (generalized): Secondary | ICD-10-CM | POA: Diagnosis not present

## 2015-12-01 DIAGNOSIS — R278 Other lack of coordination: Secondary | ICD-10-CM | POA: Diagnosis not present

## 2015-12-01 DIAGNOSIS — R296 Repeated falls: Secondary | ICD-10-CM | POA: Diagnosis not present

## 2016-04-20 DIAGNOSIS — H401132 Primary open-angle glaucoma, bilateral, moderate stage: Secondary | ICD-10-CM | POA: Diagnosis not present

## 2016-04-26 DIAGNOSIS — E559 Vitamin D deficiency, unspecified: Secondary | ICD-10-CM | POA: Diagnosis not present

## 2016-04-26 DIAGNOSIS — H4089 Other specified glaucoma: Secondary | ICD-10-CM | POA: Diagnosis not present

## 2016-04-26 DIAGNOSIS — M8580 Other specified disorders of bone density and structure, unspecified site: Secondary | ICD-10-CM | POA: Diagnosis not present

## 2016-04-26 DIAGNOSIS — Z23 Encounter for immunization: Secondary | ICD-10-CM | POA: Diagnosis not present

## 2016-04-26 DIAGNOSIS — I10 Essential (primary) hypertension: Secondary | ICD-10-CM | POA: Diagnosis not present

## 2016-04-26 DIAGNOSIS — E785 Hyperlipidemia, unspecified: Secondary | ICD-10-CM | POA: Diagnosis not present

## 2016-04-26 DIAGNOSIS — E871 Hypo-osmolality and hyponatremia: Secondary | ICD-10-CM | POA: Diagnosis not present

## 2016-08-22 DIAGNOSIS — H401132 Primary open-angle glaucoma, bilateral, moderate stage: Secondary | ICD-10-CM | POA: Diagnosis not present

## 2017-05-16 DIAGNOSIS — H401132 Primary open-angle glaucoma, bilateral, moderate stage: Secondary | ICD-10-CM | POA: Diagnosis not present

## 2017-06-11 DIAGNOSIS — H4089 Other specified glaucoma: Secondary | ICD-10-CM | POA: Diagnosis not present

## 2017-06-11 DIAGNOSIS — E785 Hyperlipidemia, unspecified: Secondary | ICD-10-CM | POA: Diagnosis not present

## 2017-06-11 DIAGNOSIS — M8580 Other specified disorders of bone density and structure, unspecified site: Secondary | ICD-10-CM | POA: Diagnosis not present

## 2017-06-11 DIAGNOSIS — I10 Essential (primary) hypertension: Secondary | ICD-10-CM | POA: Diagnosis not present

## 2017-11-21 DIAGNOSIS — N390 Urinary tract infection, site not specified: Secondary | ICD-10-CM | POA: Diagnosis not present

## 2017-11-22 DIAGNOSIS — I10 Essential (primary) hypertension: Secondary | ICD-10-CM | POA: Diagnosis not present

## 2017-11-22 DIAGNOSIS — E785 Hyperlipidemia, unspecified: Secondary | ICD-10-CM | POA: Diagnosis not present

## 2017-11-22 DIAGNOSIS — F0391 Unspecified dementia with behavioral disturbance: Secondary | ICD-10-CM | POA: Diagnosis not present

## 2017-12-18 DIAGNOSIS — Z7982 Long term (current) use of aspirin: Secondary | ICD-10-CM | POA: Diagnosis not present

## 2017-12-18 DIAGNOSIS — H409 Unspecified glaucoma: Secondary | ICD-10-CM | POA: Diagnosis not present

## 2017-12-18 DIAGNOSIS — F0390 Unspecified dementia without behavioral disturbance: Secondary | ICD-10-CM | POA: Diagnosis not present

## 2017-12-18 DIAGNOSIS — Z9181 History of falling: Secondary | ICD-10-CM | POA: Diagnosis not present

## 2017-12-18 DIAGNOSIS — I10 Essential (primary) hypertension: Secondary | ICD-10-CM | POA: Diagnosis not present

## 2017-12-19 DIAGNOSIS — R310 Gross hematuria: Secondary | ICD-10-CM | POA: Diagnosis not present

## 2017-12-21 ENCOUNTER — Emergency Department (HOSPITAL_BASED_OUTPATIENT_CLINIC_OR_DEPARTMENT_OTHER)
Admission: EM | Admit: 2017-12-21 | Discharge: 2017-12-21 | Disposition: A | Discharge status: Home / Self Care | Payer: Medicare Other | Attending: Emergency Medicine | Admitting: Emergency Medicine

## 2017-12-21 ENCOUNTER — Other Ambulatory Visit: Payer: Self-pay

## 2017-12-21 ENCOUNTER — Encounter (HOSPITAL_BASED_OUTPATIENT_CLINIC_OR_DEPARTMENT_OTHER): Payer: Self-pay | Admitting: Emergency Medicine

## 2017-12-21 ENCOUNTER — Emergency Department (HOSPITAL_BASED_OUTPATIENT_CLINIC_OR_DEPARTMENT_OTHER): Payer: Medicare Other

## 2017-12-21 DIAGNOSIS — Z7982 Long term (current) use of aspirin: Secondary | ICD-10-CM | POA: Diagnosis not present

## 2017-12-21 DIAGNOSIS — E871 Hypo-osmolality and hyponatremia: Secondary | ICD-10-CM | POA: Diagnosis not present

## 2017-12-21 DIAGNOSIS — I10 Essential (primary) hypertension: Secondary | ICD-10-CM | POA: Insufficient documentation

## 2017-12-21 DIAGNOSIS — K279 Peptic ulcer, site unspecified, unspecified as acute or chronic, without hemorrhage or perforation: Secondary | ICD-10-CM | POA: Diagnosis not present

## 2017-12-21 DIAGNOSIS — K259 Gastric ulcer, unspecified as acute or chronic, without hemorrhage or perforation: Secondary | ICD-10-CM | POA: Diagnosis not present

## 2017-12-21 DIAGNOSIS — N281 Cyst of kidney, acquired: Secondary | ICD-10-CM | POA: Diagnosis not present

## 2017-12-21 DIAGNOSIS — R339 Retention of urine, unspecified: Secondary | ICD-10-CM | POA: Insufficient documentation

## 2017-12-21 DIAGNOSIS — Z79899 Other long term (current) drug therapy: Secondary | ICD-10-CM | POA: Diagnosis not present

## 2017-12-21 DIAGNOSIS — R319 Hematuria, unspecified: Secondary | ICD-10-CM | POA: Diagnosis not present

## 2017-12-21 DIAGNOSIS — R310 Gross hematuria: Secondary | ICD-10-CM | POA: Diagnosis not present

## 2017-12-21 DIAGNOSIS — Z96642 Presence of left artificial hip joint: Secondary | ICD-10-CM | POA: Diagnosis not present

## 2017-12-21 LAB — COMPREHENSIVE METABOLIC PANEL
ALBUMIN: 3.5 g/dL (ref 3.5–5.0)
ALT: 10 U/L — ABNORMAL LOW (ref 17–63)
ANION GAP: 9 (ref 5–15)
AST: 17 U/L (ref 15–41)
Alkaline Phosphatase: 65 U/L (ref 38–126)
BUN: 16 mg/dL (ref 6–20)
CO2: 24 mmol/L (ref 22–32)
Calcium: 8.5 mg/dL — ABNORMAL LOW (ref 8.9–10.3)
Chloride: 92 mmol/L — ABNORMAL LOW (ref 101–111)
Creatinine, Ser: 0.8 mg/dL (ref 0.61–1.24)
GFR calc non Af Amer: >60 mL/min (ref >60)
GLUCOSE: 128 mg/dL — AB (ref 65–99)
POTASSIUM: 3.9 mmol/L (ref 3.5–5.1)
SODIUM: 125 mmol/L — AB (ref 135–145)
TOTAL PROTEIN: 6.2 g/dL — AB (ref 6.5–8.1)
Total Bilirubin: 0.8 mg/dL (ref 0.3–1.2)

## 2017-12-21 LAB — URINALYSIS, ROUTINE W REFLEX MICROSCOPIC
Bilirubin Urine: NEGATIVE
GLUCOSE, UA: NEGATIVE mg/dL
KETONES UR: 15 mg/dL — AB
NITRITE: NEGATIVE
PH: 5.5 (ref 5.0–8.0)
PROTEIN: 100 mg/dL — AB
Specific Gravity, Urine: 1.025 (ref 1.005–1.030)

## 2017-12-21 LAB — CBC WITH DIFFERENTIAL/PLATELET
BASOS PCT: 1 %
Basophils Absolute: 0.1 10*3/uL (ref 0.0–0.1)
EOS ABS: 0.4 10*3/uL (ref 0.0–0.7)
EOS PCT: 5 %
HCT: 38.9 % — ABNORMAL LOW (ref 39.0–52.0)
Hemoglobin: 13.6 g/dL (ref 13.0–17.0)
Lymphocytes Relative: 19 %
Lymphs Abs: 1.7 10*3/uL (ref 0.7–4.0)
MCH: 31.7 pg (ref 26.0–34.0)
MCHC: 35 g/dL (ref 30.0–36.0)
MCV: 90.7 fL (ref 78.0–100.0)
MONO ABS: 1.1 10*3/uL — AB (ref 0.1–1.0)
MONOS PCT: 12 %
Neutro Abs: 5.8 10*3/uL (ref 1.7–7.7)
Neutrophils Relative %: 63 %
Platelets: 213 10*3/uL (ref 150–400)
RBC: 4.29 MIL/uL (ref 4.22–5.81)
RDW: 13.2 % (ref 11.5–15.5)
WBC: 9 10*3/uL (ref 4.0–10.5)

## 2017-12-21 LAB — URINALYSIS, MICROSCOPIC (REFLEX): RBC / HPF: >50 RBC/hpf (ref 0–5)

## 2017-12-21 MED ORDER — SODIUM CHLORIDE 0.9 % IV BOLUS
1000.0000 mL | Freq: Once | INTRAVENOUS | Status: AC
  Administered 2017-12-21: 1000 mL via INTRAVENOUS

## 2017-12-21 MED ORDER — IOPAMIDOL (ISOVUE-300) INJECTION 61%
100.0000 mL | Freq: Once | INTRAVENOUS | Status: AC | PRN
  Administered 2017-12-21: 100 mL via INTRAVENOUS

## 2017-12-21 MED ORDER — ESOMEPRAZOLE MAGNESIUM 40 MG PO CPDR: 40.0000 mg | DELAYED_RELEASE_CAPSULE | Freq: Every day | ORAL | 0 refills | Status: AC

## 2017-12-21 MED ORDER — SULFAMETHOXAZOLE-TRIMETHOPRIM 800-160 MG PO TABS: 1.0000 | ORAL_TABLET | Freq: Two times a day (BID) | ORAL | 0 refills | Status: AC

## 2017-12-21 MED ORDER — SULFAMETHOXAZOLE-TRIMETHOPRIM 800-160 MG PO TABS
1.0000 | ORAL_TABLET | Freq: Once | ORAL | Status: AC
  Administered 2017-12-21: 1 via ORAL
  Filled 2017-12-21: qty 1

## 2017-12-21 MED ORDER — PANTOPRAZOLE SODIUM 40 MG PO TBEC
40.0000 mg | DELAYED_RELEASE_TABLET | Freq: Once | ORAL | Status: AC
  Administered 2017-12-21: 40 mg via ORAL
  Filled 2017-12-21: qty 1

## 2017-12-21 MED ORDER — LIDOCAINE HCL URETHRAL/MUCOSAL 2 % EX GEL
CUTANEOUS | Status: AC
  Administered 2017-12-21: 16:00:00
  Filled 2017-12-21: qty 20

## 2017-12-21 NOTE — ED Notes (Signed)
Patient transported to CT 

## 2017-12-21 NOTE — ED Notes (Addendum)
Pt d/c home with his son who is taking him back to his nursing home. Rx x 2 given for nexium and bactrim

## 2017-12-21 NOTE — ED Provider Notes (Signed)
[facility] EMERGENCY DEPARTMENT Provider Note   CSN: [id number] Arrival date & time: 12/21/17  1523     History   Chief Complaint Chief Complaint  Patient presents with  . Hematuria    HPI Todd Whitaker is a 84 y.o. male history of hyperlipidemia, hypertension, enlarged prostate, here presenting with hematuria.  Patient states that he noticed blood in his underwear 3 days ago.  He went to see the doctor but was unable to get a urine sample at that time.  He was unable to urinate since yesterday.  Patient states that he was drinking some water but is still unable to urinate.  Denies any abdominal pain or vomiting or fevers.  Patient has a history of enlarged prostate and had TURP many years ago.  Patient has no known renal cell or bladder cancer.  The history is provided by the patient and a relative.    Past Medical History:  Diagnosis Date  . Glaucoma   . Hyperlipidemia   . Hypertension   . Osteoporosis   . SIADH (syndrome of inappropriate ADH production) [facility])     Patient Active Problem List   Diagnosis Date Noted  . Protein-calorie malnutrition, severe (HCC) 08/31/2013  . Moderate protein-calorie malnutrition (HCC) 08/30/2013  . Hematuria 08/30/2013  . Aspiration pneumonia (HCC) 08/28/2013  . Dysphagia, unspecified(787.20) 08/28/2013  . Acute blood loss anemia 08/27/2013  . Thrombocytopenia, unspecified (HCC) 08/27/2013  . Hyponatremia 08/27/2013  . Postoperative fever 08/27/2013  . Subcapital fracture of left hip (HCC) 08/26/2013  . HTN (hypertension) 08/26/2013  . Abnormal CT of brain 08/26/2013  . Closed left hip fracture (HCC) 08/26/2013    Past Surgical History:  Procedure Laterality Date  . CATARACT EXTRACTION, BILATERAL    . HIP ARTHROPLASTY Left 08/26/2013   Procedure: ARTHROPLASTY BIPOLAR HIP;  Surgeon: Harvie Junior, MD;  Location: [facility];  Service: Orthopedics;  Laterality: Left;  . TRANSURETHRAL RESECTION OF PROSTATE          Home  Medications    Prior to Admission medications   Medication Sig Start Date End Date Taking? Authorizing Provider  acetaminophen (TYLENOL) 500 MG tablet Take 500-1,000 mg by mouth every 6 (six) hours as needed for mild pain.    [provider]  amoxicillin-clavulanate (AUGMENTIN) 875-125 MG per tablet Take 1 tablet by mouth every 12 (twelve) hours. 08/31/13   Renae Fickle, MD  aspirin EC 325 MG tablet Take 1 tablet (325 mg total) by mouth 2 (two) times daily after a meal. 08/26/13   Marshia Ly, PA-C  bisacodyl (DULCOLAX) 10 MG suppository Place 1 suppository (10 mg total) rectally daily as needed for mild constipation or moderate constipation. 08/31/13   Renae Fickle, MD  Cholecalciferol (VITAMIN D) 2000 UNITS tablet Take 2,000 Units by mouth daily.    [provider]  docusate sodium 100 MG CAPS Take 100 mg by mouth 2 (two) times daily. 08/31/13   Renae Fickle, MD  feeding supplement, ENSURE COMPLETE, (ENSURE COMPLETE) LIQD Take 237 mLs by mouth 2 (two) times daily between meals. 08/31/13   Renae Fickle, MD  feeding supplement, RESOURCE BREEZE, (RESOURCE BREEZE) LIQD Take 1 Container by mouth 3 (three) times daily between meals. 08/31/13   Renae Fickle, MD  ferrous sulfate 325 (65 FE) MG tablet Take 1 tablet (325 mg total) by mouth daily with breakfast. 08/31/13   Renae Fickle, MD  haloperidol (HALDOL) 1 MG tablet Take 1 tablet (1 mg total) by mouth at bedtime as needed  for agitation. 08/31/13   Renae FickleShort, Mackenzie, MD  HYDROcodone-acetaminophen (NORCO) 5-325 MG per tablet Take 1 tablet by mouth every 8 (eight) hours as needed for moderate pain. 08/26/13   Marshia LyBethune, James, PA-C  Multiple Vitamins-Minerals (ICAPS) TABS Take 1 tablet by mouth daily.    [provider]  pilocarpine (PILOCAR) 4 % ophthalmic solution Place 1 drop into both eyes 4 (four) times daily.    [provider]  ramipril (ALTACE) 10 MG capsule Take 10 mg by mouth daily.    [provider]  tamsulosin (FLOMAX) 0.4 MG CAPS capsule Take 1 capsule (0.4 mg total) by mouth daily after supper. 08/31/13   Renae FickleShort, Mackenzie, MD  timolol (BETIMOL) 0.5 % ophthalmic solution Place 1 drop into both eyes daily.    [provider]    Family History History reviewed. No pertinent family history.  Social History Social History   Tobacco Use  . Smoking status: Never Smoker  . Smokeless tobacco: Never Used  Substance Use Topics  . Alcohol use: No  . Drug use: No     Allergies   Patient has no known allergies.   Review of Systems Review of Systems  Genitourinary: Positive for hematuria.  All other systems reviewed and are negative.    Physical Exam Updated Vital Signs BP 139/61 (BP Location: Right Arm)   Pulse 70   Temp 97.9 F (36.6 C) (Oral)   Resp 16   Ht 5\' 10"  (1.778 m)   Wt 62.6 kg (138 lb)   SpO2 97%   BMI 19.80 kg/m   Physical Exam  Constitutional: He is oriented to person, place, and time.  Chronically ill, slightly uncomfortable   HENT:  Head: Normocephalic.  Eyes: Pupils are equal, round, and reactive to light. Conjunctivae and EOM are normal.  Neck: Normal range of motion. Neck supple.  Cardiovascular: Normal rate, regular rhythm and normal heart sounds.  Pulmonary/Chest: Effort normal and breath sounds normal. No stridor. No respiratory distress. He has no wheezes.  Abdominal: Soft.  + suprapubic tenderness, + enlarged bladder   Musculoskeletal: Normal range of motion.  Neurological: He is alert and oriented to person, place, and time.  Skin: Skin is warm.  Psychiatric: He has a normal mood and affect.  Nursing note and vitals reviewed.    ED Treatments / Results  Labs (all labs ordered are listed, but only abnormal results are displayed) Labs Reviewed  URINE CULTURE  CBC WITH DIFFERENTIAL/PLATELET  COMPREHENSIVE METABOLIC PANEL  URINALYSIS, ROUTINE W REFLEX MICROSCOPIC    EKG None  Radiology No results  found.  Procedures BLADDER CATHETERIZATION Date/Time: 12/21/2017 6:46 PM Performed by: Charlynne PanderYao, Buffi Ewton Hsienta, MD Authorized by: Charlynne PanderYao, Anastaisa Wooding Hsienta, MD   Consent:    Consent obtained:  Verbal   Consent given by:  Patient   Risks discussed:  Infection and urethral injury Pre-procedure details:    Procedure purpose:  Therapeutic Anesthesia (see MAR for exact dosages):    Anesthesia method:  Topical application   Topical anesthetic:  Lidocaine gel Procedure details:    Catheter insertion:  Indwelling   Catheter type:  Foley   Catheter size:  18 Fr   Bladder irrigation: yes     Number of attempts:  1   Urine characteristics:  Bloody Post-procedure details:    Patient tolerance of procedure:  Tolerated well, no immediate complications Comments:     Irrigated with 200 cc saline. Hematuria cleared.    (including critical care time)    Medications  Ordered in ED Medications - No data to display   Initial Impression / Assessment and Plan / ED Course  I have reviewed the triage vital signs and the nursing notes.  Pertinent labs & imaging results that were available during my care of the patient were reviewed by me and considered in my medical decision making (see chart for details).     JANZEN SACKS is a 84 y.o. male here with hematuria, possible urinary retention. His bladder scan is about 270. Unable to urinate since last night. Will place foley, get chemistry and give IVF.   6:47 PM Foley placed. I irrigated catheter and hematuria improved. CT showed multiple renal cysts, no obvious masses. There is incidental gastric ulcer as well. Will start on PPI. Will give bactrim for prophylaxis. Of note, sodium is 125, recommend repeat in a week. Will have him follow up with urology for foley removal, PCP for hyponatremia, and GI for gastric ulcer.    Final Clinical Impressions(s) / ED Diagnoses   Final diagnoses:  None    ED Discharge Orders    None       Charlynne Pander,  MD 12/21/17 [phone]

## 2017-12-21 NOTE — Discharge Instructions (Signed)
You have hematuria from renal cysts. Keep foley in place until you see urologist. Call the office on Monday for appointment.   Take bactrim twice daily for 5 days to prevent infection   You have a stomach ulcer on CT. Take nexium daily. Avoid any anti inflammatory or aspirin. Call GI for follow up   Your salt level is slightly low, repeat with your doctor in a week   See your primary care doctor, GI doctor, urologist   Return to ER if he has fever, foley not draining or grossly bloody, severe abdominal pain, vomiting

## 2017-12-21 NOTE — ED Notes (Signed)
CT must wait for results of bun/creat prior to imaging with IV contrast, [facility], pt >60 yrs of age

## 2017-12-21 NOTE — ED Triage Notes (Signed)
Patients son states that the patient has had blood in his urine since Thursday  - he called his dr and they wanted a urine sample. Per the son the patient has not voided since yesterday after noon

## 2017-12-23 LAB — URINE CULTURE: CULTURE: NO GROWTH

## 2017-12-24 DIAGNOSIS — R338 Other retention of urine: Secondary | ICD-10-CM | POA: Diagnosis not present

## 2017-12-24 DIAGNOSIS — R310 Gross hematuria: Secondary | ICD-10-CM | POA: Diagnosis not present

## 2017-12-28 ENCOUNTER — Encounter (HOSPITAL_COMMUNITY): Payer: Self-pay

## 2017-12-28 ENCOUNTER — Emergency Department (HOSPITAL_COMMUNITY)
Admission: EM | Admit: 2017-12-28 | Discharge: 2017-12-28 | Disposition: A | Discharge status: Home / Self Care | Payer: Medicare Other | Attending: Emergency Medicine | Admitting: Emergency Medicine

## 2017-12-28 ENCOUNTER — Other Ambulatory Visit: Payer: Self-pay

## 2017-12-28 DIAGNOSIS — Z7982 Long term (current) use of aspirin: Secondary | ICD-10-CM | POA: Diagnosis not present

## 2017-12-28 DIAGNOSIS — I959 Hypotension, unspecified: Secondary | ICD-10-CM | POA: Diagnosis not present

## 2017-12-28 DIAGNOSIS — I10 Essential (primary) hypertension: Secondary | ICD-10-CM | POA: Diagnosis not present

## 2017-12-28 DIAGNOSIS — R319 Hematuria, unspecified: Secondary | ICD-10-CM | POA: Diagnosis present

## 2017-12-28 DIAGNOSIS — R310 Gross hematuria: Secondary | ICD-10-CM | POA: Diagnosis not present

## 2017-12-28 DIAGNOSIS — Z79899 Other long term (current) drug therapy: Secondary | ICD-10-CM | POA: Diagnosis not present

## 2017-12-28 DIAGNOSIS — R339 Retention of urine, unspecified: Secondary | ICD-10-CM | POA: Diagnosis not present

## 2017-12-28 DIAGNOSIS — Z960 Presence of urogenital implants: Secondary | ICD-10-CM | POA: Insufficient documentation

## 2017-12-28 DIAGNOSIS — R58 Hemorrhage, not elsewhere classified: Secondary | ICD-10-CM | POA: Diagnosis not present

## 2017-12-28 NOTE — Discharge Instructions (Addendum)
You appear to have urinary retention today.  Foley catheter was replaced to help control the bleeding and improve your ability to heal, following accidental removal of the Foley catheter.  Follow-up with the urologist, next week, as scheduled.  Ask them if you need to have a medication to improve bladder function.  They also may need to evaluate for need for repeat TURP.  Return here, as needed, for problems.

## 2017-12-28 NOTE — ED Provider Notes (Signed)
[facility]-EMERGENCY DEPT Provider Note   CSN: [id number] Arrival date & time: 12/28/17  1191     History   Chief Complaint Chief Complaint  Patient presents with  . Hematuria    HPI Todd Whitaker is a 84 y.o. male.  HPI   Patient presents for evaluation of penile bleeding, after pulling his Foley catheter out.  He had a catheter placed for rectal bleeding with obstruction, about a week ago, after which he saw urology several days ago, voiding trial after removal.  He is due to see urology in 4 days for reassessment.  Patient feels like he can void at this time, and would like to try that.  Patient denies pain in penis, or abdomen at this time.  There is been no fever, chills, vomiting or dizziness.  The patient is debilitated and lives in a skilled nursing facility.  There are no other known modifying factors.  Past Medical History:  Diagnosis Date  . Glaucoma   . Hyperlipidemia   . Hypertension   . Osteoporosis   . SIADH (syndrome of inappropriate ADH production) [facility])     Patient Active Problem List   Diagnosis Date Noted  . Protein-calorie malnutrition, severe (HCC) 08/31/2013  . Moderate protein-calorie malnutrition (HCC) 08/30/2013  . Hematuria 08/30/2013  . Aspiration pneumonia (HCC) 08/28/2013  . Dysphagia, unspecified(787.20) 08/28/2013  . Acute blood loss anemia 08/27/2013  . Thrombocytopenia, unspecified (HCC) 08/27/2013  . Hyponatremia 08/27/2013  . Postoperative fever 08/27/2013  . Subcapital fracture of left hip (HCC) 08/26/2013  . HTN (hypertension) 08/26/2013  . Abnormal CT of brain 08/26/2013  . Closed left hip fracture (HCC) 08/26/2013    Past Surgical History:  Procedure Laterality Date  . CATARACT EXTRACTION, BILATERAL    . HIP ARTHROPLASTY Left 08/26/2013   Procedure: ARTHROPLASTY BIPOLAR HIP;  Surgeon: Harvie Junior, MD;  Location: [facility];  Service: Orthopedics;  Laterality: Left;  . TRANSURETHRAL RESECTION OF PROSTATE           Home Medications    Prior to Admission medications   Medication Sig Start Date End Date Taking? Authorizing Provider  acetaminophen (TYLENOL) 500 MG tablet Take 500-1,000 mg by mouth every 6 (six) hours as needed for mild pain.    [provider]  amoxicillin-clavulanate (AUGMENTIN) 875-125 MG per tablet Take 1 tablet by mouth every 12 (twelve) hours. 08/31/13   Renae Fickle, MD  aspirin EC 325 MG tablet Take 1 tablet (325 mg total) by mouth 2 (two) times daily after a meal. 08/26/13   Marshia Ly, PA-C  bisacodyl (DULCOLAX) 10 MG suppository Place 1 suppository (10 mg total) rectally daily as needed for mild constipation or moderate constipation. 08/31/13   Renae Fickle, MD  Cholecalciferol (VITAMIN D) 2000 UNITS tablet Take 2,000 Units by mouth daily.    [provider]  docusate sodium 100 MG CAPS Take 100 mg by mouth 2 (two) times daily. 08/31/13   Renae Fickle, MD  esomeprazole (NEXIUM) 40 MG capsule Take 1 capsule (40 mg total) by mouth daily. 12/21/17   Charlynne Pander, MD  feeding supplement, ENSURE COMPLETE, (ENSURE COMPLETE) LIQD Take 237 mLs by mouth 2 (two) times daily between meals. 08/31/13   Renae Fickle, MD  feeding supplement, RESOURCE BREEZE, (RESOURCE BREEZE) LIQD Take 1 Container by mouth 3 (three) times daily between meals. 08/31/13   Renae Fickle, MD  ferrous sulfate 325 (65 FE) MG tablet Take 1 tablet (325 mg total) by mouth daily with  breakfast. 08/31/13   Renae Fickle, MD  haloperidol (HALDOL) 1 MG tablet Take 1 tablet (1 mg total) by mouth at bedtime as needed for agitation. 08/31/13   Renae Fickle, MD  HYDROcodone-acetaminophen (NORCO) 5-325 MG per tablet Take 1 tablet by mouth every 8 (eight) hours as needed for moderate pain. 08/26/13   Marshia Ly, PA-C  Multiple Vitamins-Minerals (ICAPS) TABS Take 1 tablet by mouth daily.    [provider]  pilocarpine (PILOCAR) 4 % ophthalmic solution Place 1 drop into both  eyes 4 (four) times daily.    [provider]  ramipril (ALTACE) 10 MG capsule Take 10 mg by mouth daily.    [provider]  tamsulosin (FLOMAX) 0.4 MG CAPS capsule Take 1 capsule (0.4 mg total) by mouth daily after supper. 08/31/13   Renae Fickle, MD  timolol (BETIMOL) 0.5 % ophthalmic solution Place 1 drop into both eyes daily.    [provider]    Family History History reviewed. No pertinent family history.  Social History Social History   Tobacco Use  . Smoking status: Never Smoker  . Smokeless tobacco: Never Used  Substance Use Topics  . Alcohol use: No  . Drug use: No     Allergies   Patient has no known allergies.   Review of Systems Review of Systems  All other systems reviewed and are negative.    Physical Exam Updated Vital Signs BP 126/62   Pulse 75   Temp 98.1 F (36.7 C) (Oral)   Resp 16   SpO2 96%   Physical Exam  Constitutional: He is oriented to person, place, and time. He appears well-developed. No distress.  Elderly, frail  HENT:  Head: Normocephalic and atraumatic.  Right Ear: External ear normal.  Left Ear: External ear normal.  Eyes: Pupils are equal, round, and reactive to light. Conjunctivae and EOM are normal.  Neck: Normal range of motion and phonation normal. Neck supple.  Cardiovascular: Normal rate.  Pulmonary/Chest: Effort normal. He exhibits no bony tenderness.  Abdominal: Soft. There is no tenderness.  Genitourinary:  Genitourinary Comments: Normal external genitalia.  He is uncircumcised.  Foreskin retracts easily.  There is a small amount of blood beneath the foreskin and in the urethral meatus.  Penis is nontender.  Scrotum and scrotal contents are normal.  Musculoskeletal: Normal range of motion.  Neurological: He is alert and oriented to person, place, and time. No cranial nerve deficit or sensory deficit. He exhibits normal muscle tone. Coordination normal.  Skin: Skin is warm, dry and intact.   Psychiatric: He has a normal mood and affect. His behavior is normal. Judgment and thought content normal.  Nursing note and vitals reviewed.    ED Treatments / Results  Labs (all labs ordered are listed, but only abnormal results are displayed) Labs Reviewed - No data to display  EKG None  Radiology No results found.  Procedures Procedures (including critical care time)  Medications Ordered in ED Medications - No data to display   Initial Impression / Assessment and Plan / ED Course  I have reviewed the triage vital signs and the nursing notes.  Pertinent labs & imaging results that were available during my care of the patient were reviewed by me and considered in my medical decision making (see chart for details).  Clinical Course as of Dec 28 1129  Sat Dec 28, 2017  1106 He was unable to void spontaneously.  Bladder scan revealed 170 cc of urine.  Foley catheter  ordered.   [EW]    Clinical Course User Index [EW] Mancel BaleWentz, Martavis Gurney, MD     Patient Vitals for the past 24 hrs:  BP Temp Temp src Pulse Resp SpO2  12/28/17 0945 126/62 98.1 F (36.7 C) Oral 75 16 96 %    11:31 AM Reevaluation with update and discussion. After initial assessment and treatment, an updated evaluation reveals no change in clinical status.  Foley catheter placed.  Discussed treatment possibilities and necessities, with son who is a Teacher, early years/prepharmacist.  We will not proceed with medication for stimulation of bladder contraction, secondary to poor risk-benefit profile, at this time.  All questions answered. Mancel BaleElliott Jamarious Febo   Medical Decision Making: Hematuria, with urinary retention.  Suspect urethral injury from accidental dislodgment of Foley catheter.  Unclear about primary process for hematuria and need for Foley catheter last week.  No indication for hemodynamic instability, or need for additional evaluation, in the ED.  CRITICAL CARE-no Performed by: Mancel BaleElliott Demetrie Borge   Nursing Notes Reviewed/ Care  Coordinated Applicable Imaging Reviewed Interpretation of Laboratory Data incorporated into ED treatment  The patient appears reasonably screened and/or stabilized for discharge and I doubt any other medical condition or other [facility] requiring further screening, evaluation, or treatment in the ED at this time prior to discharge.  Plan: Home Medications-continue usual medications; Home Treatments-catheter care at SNF; return here if the recommended treatment, does not improve the symptoms; Recommended follow up-neurology, in 4 days, as scheduled.  Consider further treatments at that time.    Final Clinical Impressions(s) / ED Diagnoses   Final diagnoses:  Gross hematuria  Urinary retention    ED Discharge Orders    None       Mancel BaleWentz, Zebulan Hinshaw, MD 12/28/17 1136

## 2017-12-28 NOTE — ED Triage Notes (Signed)
EMS reports from [facility], Pt inadvertently pulled out foley catheter with balloon inflated, has some bleeding from penis, facility requests evaluation.  BP 126/68 HR 80 Resp 20 Sp02 99

## 2017-12-28 NOTE — ED Notes (Signed)
Bed: [id number] Expected date:  Expected time:  Means of arrival:  Comments: 84 yo blood in catheter

## 2017-12-31 DIAGNOSIS — R310 Gross hematuria: Secondary | ICD-10-CM | POA: Diagnosis not present

## 2018-01-13 DIAGNOSIS — N281 Cyst of kidney, acquired: Secondary | ICD-10-CM | POA: Diagnosis not present

## 2018-01-13 DIAGNOSIS — R338 Other retention of urine: Secondary | ICD-10-CM | POA: Diagnosis not present

## 2018-01-13 DIAGNOSIS — R310 Gross hematuria: Secondary | ICD-10-CM | POA: Diagnosis not present

## 2018-03-04 DIAGNOSIS — N390 Urinary tract infection, site not specified: Secondary | ICD-10-CM | POA: Diagnosis not present

## 2018-06-04 DIAGNOSIS — H401132 Primary open-angle glaucoma, bilateral, moderate stage: Secondary | ICD-10-CM | POA: Diagnosis not present

## 2018-06-19 DIAGNOSIS — H401132 Primary open-angle glaucoma, bilateral, moderate stage: Secondary | ICD-10-CM | POA: Diagnosis not present

## 2018-06-19 DIAGNOSIS — H353211 Exudative age-related macular degeneration, right eye, with active choroidal neovascularization: Secondary | ICD-10-CM | POA: Diagnosis not present

## 2018-06-19 DIAGNOSIS — Z961 Presence of intraocular lens: Secondary | ICD-10-CM | POA: Diagnosis not present

## 2018-06-19 DIAGNOSIS — H353122 Nonexudative age-related macular degeneration, left eye, intermediate dry stage: Secondary | ICD-10-CM | POA: Diagnosis not present

## 2018-06-20 DIAGNOSIS — R05 Cough: Secondary | ICD-10-CM | POA: Diagnosis not present

## 2018-06-20 DIAGNOSIS — R0989 Other specified symptoms and signs involving the circulatory and respiratory systems: Secondary | ICD-10-CM | POA: Diagnosis not present

## 2018-08-24 DIAGNOSIS — R079 Chest pain, unspecified: Secondary | ICD-10-CM | POA: Diagnosis not present

## 2018-08-25 DIAGNOSIS — E568 Deficiency of other vitamins: Secondary | ICD-10-CM | POA: Diagnosis not present

## 2018-08-25 DIAGNOSIS — R2689 Other abnormalities of gait and mobility: Secondary | ICD-10-CM | POA: Diagnosis not present

## 2018-08-25 DIAGNOSIS — I10 Essential (primary) hypertension: Secondary | ICD-10-CM | POA: Diagnosis not present

## 2018-08-25 DIAGNOSIS — R296 Repeated falls: Secondary | ICD-10-CM | POA: Diagnosis not present

## 2018-08-25 DIAGNOSIS — F0150 Vascular dementia without behavioral disturbance: Secondary | ICD-10-CM | POA: Diagnosis not present

## 2018-08-25 DIAGNOSIS — M6281 Muscle weakness (generalized): Secondary | ICD-10-CM | POA: Diagnosis not present

## 2018-08-25 DIAGNOSIS — E785 Hyperlipidemia, unspecified: Secondary | ICD-10-CM | POA: Diagnosis not present

## 2018-08-29 DIAGNOSIS — K219 Gastro-esophageal reflux disease without esophagitis: Secondary | ICD-10-CM | POA: Diagnosis not present

## 2018-08-29 DIAGNOSIS — I10 Essential (primary) hypertension: Secondary | ICD-10-CM | POA: Diagnosis not present

## 2018-08-29 DIAGNOSIS — G301 Alzheimer's disease with late onset: Secondary | ICD-10-CM | POA: Diagnosis not present

## 2018-08-29 DIAGNOSIS — N400 Enlarged prostate without lower urinary tract symptoms: Secondary | ICD-10-CM | POA: Diagnosis not present

## 2018-09-01 DIAGNOSIS — I10 Essential (primary) hypertension: Secondary | ICD-10-CM | POA: Diagnosis not present

## 2018-09-01 DIAGNOSIS — M6281 Muscle weakness (generalized): Secondary | ICD-10-CM | POA: Diagnosis not present

## 2018-09-01 DIAGNOSIS — R296 Repeated falls: Secondary | ICD-10-CM | POA: Diagnosis not present

## 2018-09-01 DIAGNOSIS — F0390 Unspecified dementia without behavioral disturbance: Secondary | ICD-10-CM | POA: Diagnosis not present

## 2018-09-01 DIAGNOSIS — R2689 Other abnormalities of gait and mobility: Secondary | ICD-10-CM | POA: Diagnosis not present

## 2018-09-01 DIAGNOSIS — R1312 Dysphagia, oropharyngeal phase: Secondary | ICD-10-CM | POA: Diagnosis not present

## 2018-09-01 DIAGNOSIS — F0150 Vascular dementia without behavioral disturbance: Secondary | ICD-10-CM | POA: Diagnosis not present

## 2018-09-02 DIAGNOSIS — F0391 Unspecified dementia with behavioral disturbance: Secondary | ICD-10-CM | POA: Diagnosis not present

## 2018-09-02 DIAGNOSIS — F419 Anxiety disorder, unspecified: Secondary | ICD-10-CM | POA: Diagnosis not present

## 2018-09-02 DIAGNOSIS — F339 Major depressive disorder, recurrent, unspecified: Secondary | ICD-10-CM | POA: Diagnosis not present

## 2018-09-03 DIAGNOSIS — F0390 Unspecified dementia without behavioral disturbance: Secondary | ICD-10-CM | POA: Diagnosis not present

## 2018-09-03 DIAGNOSIS — H02105 Unspecified ectropion of left lower eyelid: Secondary | ICD-10-CM | POA: Diagnosis not present

## 2018-09-03 DIAGNOSIS — F329 Major depressive disorder, single episode, unspecified: Secondary | ICD-10-CM | POA: Diagnosis not present

## 2018-09-03 DIAGNOSIS — H409 Unspecified glaucoma: Secondary | ICD-10-CM | POA: Diagnosis not present

## 2018-09-05 DIAGNOSIS — I10 Essential (primary) hypertension: Secondary | ICD-10-CM | POA: Diagnosis not present

## 2018-09-05 DIAGNOSIS — F0390 Unspecified dementia without behavioral disturbance: Secondary | ICD-10-CM | POA: Diagnosis not present

## 2018-09-05 DIAGNOSIS — R296 Repeated falls: Secondary | ICD-10-CM | POA: Diagnosis not present

## 2018-09-05 DIAGNOSIS — R1312 Dysphagia, oropharyngeal phase: Secondary | ICD-10-CM | POA: Diagnosis not present

## 2018-09-05 DIAGNOSIS — F0150 Vascular dementia without behavioral disturbance: Secondary | ICD-10-CM | POA: Diagnosis not present

## 2018-09-05 DIAGNOSIS — R2689 Other abnormalities of gait and mobility: Secondary | ICD-10-CM | POA: Diagnosis not present

## 2018-09-05 DIAGNOSIS — M6281 Muscle weakness (generalized): Secondary | ICD-10-CM | POA: Diagnosis not present

## 2018-09-10 DIAGNOSIS — F0390 Unspecified dementia without behavioral disturbance: Secondary | ICD-10-CM | POA: Diagnosis not present

## 2018-09-10 DIAGNOSIS — R2689 Other abnormalities of gait and mobility: Secondary | ICD-10-CM | POA: Diagnosis not present

## 2018-09-10 DIAGNOSIS — M6281 Muscle weakness (generalized): Secondary | ICD-10-CM | POA: Diagnosis not present

## 2018-09-10 DIAGNOSIS — I10 Essential (primary) hypertension: Secondary | ICD-10-CM | POA: Diagnosis not present

## 2018-09-10 DIAGNOSIS — R1312 Dysphagia, oropharyngeal phase: Secondary | ICD-10-CM | POA: Diagnosis not present

## 2018-09-10 DIAGNOSIS — R296 Repeated falls: Secondary | ICD-10-CM | POA: Diagnosis not present

## 2018-09-10 DIAGNOSIS — F0150 Vascular dementia without behavioral disturbance: Secondary | ICD-10-CM | POA: Diagnosis not present

## 2018-09-11 DIAGNOSIS — M6281 Muscle weakness (generalized): Secondary | ICD-10-CM | POA: Diagnosis not present

## 2018-09-11 DIAGNOSIS — I10 Essential (primary) hypertension: Secondary | ICD-10-CM | POA: Diagnosis not present

## 2018-09-11 DIAGNOSIS — F0390 Unspecified dementia without behavioral disturbance: Secondary | ICD-10-CM | POA: Diagnosis not present

## 2018-09-11 DIAGNOSIS — R296 Repeated falls: Secondary | ICD-10-CM | POA: Diagnosis not present

## 2018-09-11 DIAGNOSIS — F0150 Vascular dementia without behavioral disturbance: Secondary | ICD-10-CM | POA: Diagnosis not present

## 2018-09-11 DIAGNOSIS — R2689 Other abnormalities of gait and mobility: Secondary | ICD-10-CM | POA: Diagnosis not present

## 2018-09-11 DIAGNOSIS — R1312 Dysphagia, oropharyngeal phase: Secondary | ICD-10-CM | POA: Diagnosis not present

## 2018-09-12 DIAGNOSIS — I10 Essential (primary) hypertension: Secondary | ICD-10-CM | POA: Diagnosis not present

## 2018-09-12 DIAGNOSIS — M6281 Muscle weakness (generalized): Secondary | ICD-10-CM | POA: Diagnosis not present

## 2018-09-12 DIAGNOSIS — R296 Repeated falls: Secondary | ICD-10-CM | POA: Diagnosis not present

## 2018-09-12 DIAGNOSIS — R1312 Dysphagia, oropharyngeal phase: Secondary | ICD-10-CM | POA: Diagnosis not present

## 2018-09-12 DIAGNOSIS — R2689 Other abnormalities of gait and mobility: Secondary | ICD-10-CM | POA: Diagnosis not present

## 2018-09-12 DIAGNOSIS — F0390 Unspecified dementia without behavioral disturbance: Secondary | ICD-10-CM | POA: Diagnosis not present

## 2018-09-12 DIAGNOSIS — F0150 Vascular dementia without behavioral disturbance: Secondary | ICD-10-CM | POA: Diagnosis not present

## 2018-09-16 DIAGNOSIS — I10 Essential (primary) hypertension: Secondary | ICD-10-CM | POA: Diagnosis not present

## 2018-09-16 DIAGNOSIS — R2689 Other abnormalities of gait and mobility: Secondary | ICD-10-CM | POA: Diagnosis not present

## 2018-09-16 DIAGNOSIS — R1312 Dysphagia, oropharyngeal phase: Secondary | ICD-10-CM | POA: Diagnosis not present

## 2018-09-16 DIAGNOSIS — F0390 Unspecified dementia without behavioral disturbance: Secondary | ICD-10-CM | POA: Diagnosis not present

## 2018-09-16 DIAGNOSIS — R296 Repeated falls: Secondary | ICD-10-CM | POA: Diagnosis not present

## 2018-09-16 DIAGNOSIS — M6281 Muscle weakness (generalized): Secondary | ICD-10-CM | POA: Diagnosis not present

## 2018-09-16 DIAGNOSIS — F0150 Vascular dementia without behavioral disturbance: Secondary | ICD-10-CM | POA: Diagnosis not present

## 2018-09-18 DIAGNOSIS — F0150 Vascular dementia without behavioral disturbance: Secondary | ICD-10-CM | POA: Diagnosis not present

## 2018-09-18 DIAGNOSIS — I10 Essential (primary) hypertension: Secondary | ICD-10-CM | POA: Diagnosis not present

## 2018-09-18 DIAGNOSIS — R296 Repeated falls: Secondary | ICD-10-CM | POA: Diagnosis not present

## 2018-09-18 DIAGNOSIS — F0390 Unspecified dementia without behavioral disturbance: Secondary | ICD-10-CM | POA: Diagnosis not present

## 2018-09-18 DIAGNOSIS — M6281 Muscle weakness (generalized): Secondary | ICD-10-CM | POA: Diagnosis not present

## 2018-09-18 DIAGNOSIS — R1312 Dysphagia, oropharyngeal phase: Secondary | ICD-10-CM | POA: Diagnosis not present

## 2018-09-18 DIAGNOSIS — R2689 Other abnormalities of gait and mobility: Secondary | ICD-10-CM | POA: Diagnosis not present

## 2018-09-19 DIAGNOSIS — F0390 Unspecified dementia without behavioral disturbance: Secondary | ICD-10-CM | POA: Diagnosis not present

## 2018-09-19 DIAGNOSIS — M6281 Muscle weakness (generalized): Secondary | ICD-10-CM | POA: Diagnosis not present

## 2018-09-19 DIAGNOSIS — I10 Essential (primary) hypertension: Secondary | ICD-10-CM | POA: Diagnosis not present

## 2018-09-19 DIAGNOSIS — R2689 Other abnormalities of gait and mobility: Secondary | ICD-10-CM | POA: Diagnosis not present

## 2018-09-19 DIAGNOSIS — R1312 Dysphagia, oropharyngeal phase: Secondary | ICD-10-CM | POA: Diagnosis not present

## 2018-09-19 DIAGNOSIS — R296 Repeated falls: Secondary | ICD-10-CM | POA: Diagnosis not present

## 2018-09-19 DIAGNOSIS — F0150 Vascular dementia without behavioral disturbance: Secondary | ICD-10-CM | POA: Diagnosis not present

## 2018-09-22 DIAGNOSIS — I10 Essential (primary) hypertension: Secondary | ICD-10-CM | POA: Diagnosis not present

## 2018-09-22 DIAGNOSIS — R296 Repeated falls: Secondary | ICD-10-CM | POA: Diagnosis not present

## 2018-09-22 DIAGNOSIS — M6281 Muscle weakness (generalized): Secondary | ICD-10-CM | POA: Diagnosis not present

## 2018-09-22 DIAGNOSIS — F0390 Unspecified dementia without behavioral disturbance: Secondary | ICD-10-CM | POA: Diagnosis not present

## 2018-09-22 DIAGNOSIS — F0150 Vascular dementia without behavioral disturbance: Secondary | ICD-10-CM | POA: Diagnosis not present

## 2018-09-22 DIAGNOSIS — R2689 Other abnormalities of gait and mobility: Secondary | ICD-10-CM | POA: Diagnosis not present

## 2018-09-22 DIAGNOSIS — R1312 Dysphagia, oropharyngeal phase: Secondary | ICD-10-CM | POA: Diagnosis not present

## 2018-10-06 DIAGNOSIS — G301 Alzheimer's disease with late onset: Secondary | ICD-10-CM | POA: Diagnosis not present

## 2018-10-06 DIAGNOSIS — F339 Major depressive disorder, recurrent, unspecified: Secondary | ICD-10-CM | POA: Diagnosis not present

## 2018-10-06 DIAGNOSIS — K219 Gastro-esophageal reflux disease without esophagitis: Secondary | ICD-10-CM | POA: Diagnosis not present

## 2018-10-06 DIAGNOSIS — I10 Essential (primary) hypertension: Secondary | ICD-10-CM | POA: Diagnosis not present

## 2018-11-26 DIAGNOSIS — N400 Enlarged prostate without lower urinary tract symptoms: Secondary | ICD-10-CM | POA: Diagnosis not present

## 2018-11-26 DIAGNOSIS — G301 Alzheimer's disease with late onset: Secondary | ICD-10-CM | POA: Diagnosis not present

## 2018-11-26 DIAGNOSIS — F329 Major depressive disorder, single episode, unspecified: Secondary | ICD-10-CM | POA: Diagnosis not present

## 2018-11-26 DIAGNOSIS — I10 Essential (primary) hypertension: Secondary | ICD-10-CM | POA: Diagnosis not present

## 2019-01-02 DIAGNOSIS — N400 Enlarged prostate without lower urinary tract symptoms: Secondary | ICD-10-CM | POA: Diagnosis not present

## 2019-01-02 DIAGNOSIS — I10 Essential (primary) hypertension: Secondary | ICD-10-CM | POA: Diagnosis not present

## 2019-01-02 DIAGNOSIS — F22 Delusional disorders: Secondary | ICD-10-CM | POA: Diagnosis not present

## 2019-01-02 DIAGNOSIS — G301 Alzheimer's disease with late onset: Secondary | ICD-10-CM | POA: Diagnosis not present

## 2019-01-06 DIAGNOSIS — F419 Anxiety disorder, unspecified: Secondary | ICD-10-CM | POA: Diagnosis not present

## 2019-01-06 DIAGNOSIS — F339 Major depressive disorder, recurrent, unspecified: Secondary | ICD-10-CM | POA: Diagnosis not present

## 2019-01-06 DIAGNOSIS — F0391 Unspecified dementia with behavioral disturbance: Secondary | ICD-10-CM | POA: Diagnosis not present

## 2019-01-28 DIAGNOSIS — B342 Coronavirus infection, unspecified: Secondary | ICD-10-CM | POA: Diagnosis not present

## 2019-03-26 DIAGNOSIS — I10 Essential (primary) hypertension: Secondary | ICD-10-CM | POA: Diagnosis not present

## 2019-03-26 DIAGNOSIS — F329 Major depressive disorder, single episode, unspecified: Secondary | ICD-10-CM | POA: Diagnosis not present

## 2019-03-26 DIAGNOSIS — R1310 Dysphagia, unspecified: Secondary | ICD-10-CM | POA: Diagnosis not present

## 2019-03-26 DIAGNOSIS — F0391 Unspecified dementia with behavioral disturbance: Secondary | ICD-10-CM | POA: Diagnosis not present

## 2019-03-31 DIAGNOSIS — F0391 Unspecified dementia with behavioral disturbance: Secondary | ICD-10-CM | POA: Diagnosis not present

## 2019-03-31 DIAGNOSIS — F339 Major depressive disorder, recurrent, unspecified: Secondary | ICD-10-CM | POA: Diagnosis not present

## 2019-03-31 DIAGNOSIS — F419 Anxiety disorder, unspecified: Secondary | ICD-10-CM | POA: Diagnosis not present

## 2019-04-22 DIAGNOSIS — R0602 Shortness of breath: Secondary | ICD-10-CM | POA: Diagnosis not present

## 2019-04-28 DIAGNOSIS — F419 Anxiety disorder, unspecified: Secondary | ICD-10-CM | POA: Diagnosis not present

## 2019-04-28 DIAGNOSIS — F0391 Unspecified dementia with behavioral disturbance: Secondary | ICD-10-CM | POA: Diagnosis not present

## 2019-04-28 DIAGNOSIS — F339 Major depressive disorder, recurrent, unspecified: Secondary | ICD-10-CM | POA: Diagnosis not present

## 2019-05-08 DIAGNOSIS — F0391 Unspecified dementia with behavioral disturbance: Secondary | ICD-10-CM | POA: Diagnosis not present

## 2019-05-08 DIAGNOSIS — N400 Enlarged prostate without lower urinary tract symptoms: Secondary | ICD-10-CM | POA: Diagnosis not present

## 2019-05-08 DIAGNOSIS — F339 Major depressive disorder, recurrent, unspecified: Secondary | ICD-10-CM | POA: Diagnosis not present

## 2019-05-08 DIAGNOSIS — I10 Essential (primary) hypertension: Secondary | ICD-10-CM | POA: Diagnosis not present

## 2019-05-26 DIAGNOSIS — F419 Anxiety disorder, unspecified: Secondary | ICD-10-CM | POA: Diagnosis not present

## 2019-05-26 DIAGNOSIS — F0391 Unspecified dementia with behavioral disturbance: Secondary | ICD-10-CM | POA: Diagnosis not present

## 2019-05-26 DIAGNOSIS — F339 Major depressive disorder, recurrent, unspecified: Secondary | ICD-10-CM | POA: Diagnosis not present

## 2019-06-09 DIAGNOSIS — F419 Anxiety disorder, unspecified: Secondary | ICD-10-CM | POA: Diagnosis not present

## 2019-06-09 DIAGNOSIS — F339 Major depressive disorder, recurrent, unspecified: Secondary | ICD-10-CM | POA: Diagnosis not present

## 2019-06-09 DIAGNOSIS — F0391 Unspecified dementia with behavioral disturbance: Secondary | ICD-10-CM | POA: Diagnosis not present

## 2019-07-21 DIAGNOSIS — F22 Delusional disorders: Secondary | ICD-10-CM | POA: Diagnosis not present

## 2019-07-21 DIAGNOSIS — F0391 Unspecified dementia with behavioral disturbance: Secondary | ICD-10-CM | POA: Diagnosis not present

## 2019-07-21 DIAGNOSIS — F419 Anxiety disorder, unspecified: Secondary | ICD-10-CM | POA: Diagnosis not present

## 2019-07-21 DIAGNOSIS — F329 Major depressive disorder, single episode, unspecified: Secondary | ICD-10-CM | POA: Diagnosis not present

## 2019-07-29 DIAGNOSIS — F0391 Unspecified dementia with behavioral disturbance: Secondary | ICD-10-CM | POA: Diagnosis not present

## 2019-07-29 DIAGNOSIS — K219 Gastro-esophageal reflux disease without esophagitis: Secondary | ICD-10-CM | POA: Diagnosis not present

## 2019-07-29 DIAGNOSIS — N400 Enlarged prostate without lower urinary tract symptoms: Secondary | ICD-10-CM | POA: Diagnosis not present

## 2019-07-29 DIAGNOSIS — I10 Essential (primary) hypertension: Secondary | ICD-10-CM | POA: Diagnosis not present

## 2019-09-04 DIAGNOSIS — F339 Major depressive disorder, recurrent, unspecified: Secondary | ICD-10-CM | POA: Diagnosis not present

## 2019-09-04 DIAGNOSIS — N400 Enlarged prostate without lower urinary tract symptoms: Secondary | ICD-10-CM | POA: Diagnosis not present

## 2019-09-04 DIAGNOSIS — I10 Essential (primary) hypertension: Secondary | ICD-10-CM | POA: Diagnosis not present

## 2019-09-04 DIAGNOSIS — F0391 Unspecified dementia with behavioral disturbance: Secondary | ICD-10-CM | POA: Diagnosis not present

## 2019-09-19 ENCOUNTER — Emergency Department (HOSPITAL_BASED_OUTPATIENT_CLINIC_OR_DEPARTMENT_OTHER)
Admission: EM | Admit: 2019-09-19 | Discharge: 2019-09-19 | Disposition: A | Discharge status: Home / Self Care | Payer: Medicare Other | Attending: Emergency Medicine | Admitting: Emergency Medicine

## 2019-09-19 ENCOUNTER — Emergency Department (HOSPITAL_BASED_OUTPATIENT_CLINIC_OR_DEPARTMENT_OTHER): Payer: Medicare Other

## 2019-09-19 ENCOUNTER — Encounter (HOSPITAL_BASED_OUTPATIENT_CLINIC_OR_DEPARTMENT_OTHER): Payer: Self-pay | Admitting: *Deleted

## 2019-09-19 DIAGNOSIS — I10 Essential (primary) hypertension: Secondary | ICD-10-CM | POA: Insufficient documentation

## 2019-09-19 DIAGNOSIS — R456 Violent behavior: Secondary | ICD-10-CM | POA: Diagnosis not present

## 2019-09-19 DIAGNOSIS — S99911A Unspecified injury of right ankle, initial encounter: Secondary | ICD-10-CM | POA: Diagnosis present

## 2019-09-19 DIAGNOSIS — W19XXXA Unspecified fall, initial encounter: Secondary | ICD-10-CM

## 2019-09-19 DIAGNOSIS — S8261XA Displaced fracture of lateral malleolus of right fibula, initial encounter for closed fracture: Secondary | ICD-10-CM | POA: Insufficient documentation

## 2019-09-19 DIAGNOSIS — Z96642 Presence of left artificial hip joint: Secondary | ICD-10-CM | POA: Insufficient documentation

## 2019-09-19 DIAGNOSIS — R279 Unspecified lack of coordination: Secondary | ICD-10-CM | POA: Diagnosis not present

## 2019-09-19 DIAGNOSIS — Y939 Activity, unspecified: Secondary | ICD-10-CM | POA: Insufficient documentation

## 2019-09-19 DIAGNOSIS — Z79899 Other long term (current) drug therapy: Secondary | ICD-10-CM | POA: Diagnosis not present

## 2019-09-19 DIAGNOSIS — M25471 Effusion, right ankle: Secondary | ICD-10-CM | POA: Diagnosis not present

## 2019-09-19 DIAGNOSIS — W010XXA Fall on same level from slipping, tripping and stumbling without subsequent striking against object, initial encounter: Secondary | ICD-10-CM | POA: Insufficient documentation

## 2019-09-19 DIAGNOSIS — Y999 Unspecified external cause status: Secondary | ICD-10-CM | POA: Diagnosis not present

## 2019-09-19 DIAGNOSIS — R5381 Other malaise: Secondary | ICD-10-CM | POA: Diagnosis not present

## 2019-09-19 DIAGNOSIS — Z743 Need for continuous supervision: Secondary | ICD-10-CM | POA: Diagnosis not present

## 2019-09-19 DIAGNOSIS — Y92129 Unspecified place in nursing home as the place of occurrence of the external cause: Secondary | ICD-10-CM | POA: Insufficient documentation

## 2019-09-19 DIAGNOSIS — M25571 Pain in right ankle and joints of right foot: Secondary | ICD-10-CM | POA: Diagnosis not present

## 2019-09-19 DIAGNOSIS — F0390 Unspecified dementia without behavioral disturbance: Secondary | ICD-10-CM | POA: Insufficient documentation

## 2019-09-19 DIAGNOSIS — Z7982 Long term (current) use of aspirin: Secondary | ICD-10-CM | POA: Insufficient documentation

## 2019-09-19 HISTORY — DX: Unspecified dementia, unspecified severity, without behavioral disturbance, psychotic disturbance, mood disturbance, and anxiety: F03.90

## 2019-09-19 NOTE — ED Notes (Signed)
Attempted to call report to [facility]; all numbers on website go to voicemail.

## 2019-09-19 NOTE — ED Triage Notes (Signed)
Per EMS: pt from [facility]. States that pt fell, confirmed (R) fractured ankle. Pt demented. VSS

## 2019-09-19 NOTE — ED Notes (Signed)
Contacted [facility] - Patient ready for transport back to residence at [place], Dementia Unit

## 2019-09-19 NOTE — ED Provider Notes (Signed)
[facility] EMERGENCY DEPARTMENT Provider Note   CSN: [id number] Arrival date & time: 09/19/19  1849     History Chief Complaint  Patient presents with  . Ankle Injury    Todd Whitaker is a 86 y.o. male.  Pt presents to the ED today with a right ankle fracture.  Per EMS, pt fell last night and the staff noticed that his right ankle was swollen.  They had a portable xray done which showed an ankle fx.  Films did not come with the pt, just the reading.  Pt denies any pain and does not appear to have any other injuries.  He is not on blood thinners.  Pt is in the SNF part of [facility] in the memory unit.  Pt's son said he does not walk much and is not supposed to walk by himself.  Pt is at his nl MS baseline per son.        Past Medical History:  Diagnosis Date  . Dementia (HCC)   . Glaucoma   . Hyperlipidemia   . Hypertension   . Osteoporosis   . SIADH (syndrome of inappropriate ADH production) [facility])     Patient Active Problem List   Diagnosis Date Noted  . Protein-calorie malnutrition, severe (HCC) 08/31/2013  . Moderate protein-calorie malnutrition (HCC) 08/30/2013  . Hematuria 08/30/2013  . Aspiration pneumonia (HCC) 08/28/2013  . Dysphagia, unspecified(787.20) 08/28/2013  . Acute blood loss anemia 08/27/2013  . Thrombocytopenia, unspecified (HCC) 08/27/2013  . Hyponatremia 08/27/2013  . Postoperative fever 08/27/2013  . Subcapital fracture of left hip (HCC) 08/26/2013  . HTN (hypertension) 08/26/2013  . Abnormal CT of brain 08/26/2013  . Closed left hip fracture (HCC) 08/26/2013    Past Surgical History:  Procedure Laterality Date  . CATARACT EXTRACTION, BILATERAL    . HIP ARTHROPLASTY Left 08/26/2013   Procedure: ARTHROPLASTY BIPOLAR HIP;  Surgeon: Harvie Junior, MD;  Location: [facility];  Service: Orthopedics;  Laterality: Left;  . TRANSURETHRAL RESECTION OF PROSTATE         History reviewed. No pertinent family history.  Social History    Tobacco Use  . Smoking status: Never Smoker  . Smokeless tobacco: Never Used  Substance Use Topics  . Alcohol use: No  . Drug use: No    Home Medications Prior to Admission medications   Medication Sig Start Date End Date Taking? Authorizing Provider  acetaminophen (TYLENOL) 500 MG tablet Take 500-1,000 mg by mouth every 6 (six) hours as needed for mild pain.    [provider]  amoxicillin-clavulanate (AUGMENTIN) 875-125 MG per tablet Take 1 tablet by mouth every 12 (twelve) hours. 08/31/13   Renae Fickle, MD  aspirin EC 325 MG tablet Take 1 tablet (325 mg total) by mouth 2 (two) times daily after a meal. 08/26/13   Marshia Ly, PA-C  bisacodyl (DULCOLAX) 10 MG suppository Place 1 suppository (10 mg total) rectally daily as needed for mild constipation or moderate constipation. 08/31/13   Renae Fickle, MD  Cholecalciferol (VITAMIN D) 2000 UNITS tablet Take 2,000 Units by mouth daily.    [provider]  docusate sodium 100 MG CAPS Take 100 mg by mouth 2 (two) times daily. 08/31/13   Renae Fickle, MD  esomeprazole (NEXIUM) 40 MG capsule Take 1 capsule (40 mg total) by mouth daily. 12/21/17   Charlynne Pander, MD  feeding supplement, ENSURE COMPLETE, (ENSURE COMPLETE) LIQD Take 237 mLs by mouth 2 (two) times daily between meals. 08/31/13   Short,  Noah Delaine, MD  feeding supplement, RESOURCE BREEZE, (RESOURCE BREEZE) LIQD Take 1 Container by mouth 3 (three) times daily between meals. 08/31/13   Janece Canterbury, MD  ferrous sulfate 325 (65 FE) MG tablet Take 1 tablet (325 mg total) by mouth daily with breakfast. 08/31/13   Janece Canterbury, MD  haloperidol (HALDOL) 1 MG tablet Take 1 tablet (1 mg total) by mouth at bedtime as needed for agitation. 08/31/13   Janece Canterbury, MD  HYDROcodone-acetaminophen (NORCO) 5-325 MG per tablet Take 1 tablet by mouth every 8 (eight) hours as needed for moderate pain. 08/26/13   Gary Fleet, PA-C  Multiple Vitamins-Minerals (ICAPS) TABS  Take 1 tablet by mouth daily.    [provider]  pilocarpine (PILOCAR) 4 % ophthalmic solution Place 1 drop into both eyes 4 (four) times daily.    [provider]  ramipril (ALTACE) 10 MG capsule Take 10 mg by mouth daily.    [provider]  tamsulosin (FLOMAX) 0.4 MG CAPS capsule Take 1 capsule (0.4 mg total) by mouth daily after supper. 08/31/13   Janece Canterbury, MD  timolol (BETIMOL) 0.5 % ophthalmic solution Place 1 drop into both eyes daily.    [provider]    Allergies    Patient has no known allergies.  Review of Systems   Review of Systems  Musculoskeletal:       Right ankle pain  All other systems reviewed and are negative.   Physical Exam Updated Vital Signs BP (!) 123/107 (BP Location: Left Arm) Comment: pt moving in BP, not wanting to have VS taken   Pulse 76   Temp (!) 97.4 F (36.3 C) (Oral)   Resp 18   Ht 5\' 10"  (1.778 m)   Wt 59.9 kg   SpO2 98%   BMI 18.94 kg/m   Physical Exam Vitals and nursing note reviewed.  Constitutional:      Appearance: Normal appearance.  HENT:     Head: Normocephalic and atraumatic.     Right Ear: External ear normal.     Left Ear: External ear normal.     Nose: Nose normal.     Mouth/Throat:     Mouth: Mucous membranes are moist.     Pharynx: Oropharynx is clear.  Eyes:     Extraocular Movements: Extraocular movements intact.     Conjunctiva/sclera: Conjunctivae normal.     Pupils: Pupils are equal, round, and reactive to light.  Cardiovascular:     Rate and Rhythm: Normal rate and regular rhythm.     Pulses: Normal pulses.     Heart sounds: Normal heart sounds.  Pulmonary:     Effort: Pulmonary effort is normal.     Breath sounds: Normal breath sounds.  Abdominal:     General: Abdomen is flat. Bowel sounds are normal.     Palpations: Abdomen is soft.  Musculoskeletal:     Cervical back: Normal range of motion and neck supple.       Legs:  Skin:    General: Skin is warm.      Capillary Refill: Capillary refill takes less than 2 seconds.  Neurological:     Mental Status: He is alert. Mental status is at baseline. He is disoriented.     ED Results / Procedures / Treatments   Labs (all labs ordered are listed, but only abnormal results are displayed) Labs Reviewed - No data to display  EKG None  Radiology DG Ankle Complete Right  Result Date: 09/19/2019 CLINICAL DATA:  Fall from standing position, right ankle pain, temporary splint in position EXAM: RIGHT ANKLE - COMPLETE 3+ VIEW COMPARISON:  None FINDINGS: There is a transsyndesmotic Weber B fracture of the distal fibula. No acute fracture of the medial or posterior malleolus is evident. Some corticated fragmentation adjacent the tip of the medial malleolus likely reflects remote avulsion type injury or degenerative change. There is associated circumferential soft tissue swelling of the ankle with moderate ankle joint effusion. Ankle mortise remains congruent without atypical widening of the medial or lateral clear space. Included portions of the mid and hindfoot are unremarkable on these nondedicated nonweightbearing radiographs. IMPRESSION: Oblique trans syndesmotic fracture (Weber B) of the distal fibula. No acute fracture of the medial or posterior malleolus is evident. No talar shift. Well corticated fragmentation adjacent the tip of the medial malleolus more likely reflects remote injury versus degenerative change. Electronically Signed   By: Kreg Shropshire M.D.   On: 09/19/2019 19:55    Procedures Procedures (including critical care time)  Medications Ordered in ED Medications - No data to display  ED Course  I have reviewed the triage vital signs and the nursing notes.  Pertinent labs & imaging results that were available during my care of the patient were reviewed by me and considered in my medical decision making (see chart for details).    MDM Rules/Calculators/A&P                      Pt  placed in a Cam walker and d/c back to [facility].  Pt has seen Dr. Luiz Blare in the past for a hip fx and pt's son wishes for him to go back to him.  Pt is stable for d/c. Return if worse.  Final Clinical Impression(s) / ED Diagnoses Final diagnoses:  Closed fracture of distal lateral malleolus of right fibula, initial encounter  Fall, initial encounter    Rx / DC Orders ED Discharge Orders    None       Jacalyn Lefevre, MD 09/19/19 2002

## 2019-09-19 NOTE — ED Notes (Signed)
Portable Xray at bedside.

## 2019-09-21 DIAGNOSIS — S82839A Other fracture of upper and lower end of unspecified fibula, initial encounter for closed fracture: Secondary | ICD-10-CM | POA: Diagnosis not present

## 2019-10-01 DIAGNOSIS — D649 Anemia, unspecified: Secondary | ICD-10-CM | POA: Diagnosis not present

## 2019-10-19 DIAGNOSIS — S82839A Other fracture of upper and lower end of unspecified fibula, initial encounter for closed fracture: Secondary | ICD-10-CM | POA: Diagnosis not present

## 2019-11-16 DIAGNOSIS — F0391 Unspecified dementia with behavioral disturbance: Secondary | ICD-10-CM | POA: Diagnosis not present

## 2019-11-16 DIAGNOSIS — N400 Enlarged prostate without lower urinary tract symptoms: Secondary | ICD-10-CM | POA: Diagnosis not present

## 2019-11-16 DIAGNOSIS — I10 Essential (primary) hypertension: Secondary | ICD-10-CM | POA: Diagnosis not present

## 2019-11-16 DIAGNOSIS — K219 Gastro-esophageal reflux disease without esophagitis: Secondary | ICD-10-CM | POA: Diagnosis not present

## 2019-11-20 DIAGNOSIS — S82839A Other fracture of upper and lower end of unspecified fibula, initial encounter for closed fracture: Secondary | ICD-10-CM | POA: Diagnosis not present

## 2019-11-20 DIAGNOSIS — S8251XA Displaced fracture of medial malleolus of right tibia, initial encounter for closed fracture: Secondary | ICD-10-CM | POA: Diagnosis not present

## 2019-12-08 DIAGNOSIS — F329 Major depressive disorder, single episode, unspecified: Secondary | ICD-10-CM | POA: Diagnosis not present

## 2019-12-08 DIAGNOSIS — F0391 Unspecified dementia with behavioral disturbance: Secondary | ICD-10-CM | POA: Diagnosis not present

## 2019-12-08 DIAGNOSIS — F22 Delusional disorders: Secondary | ICD-10-CM | POA: Diagnosis not present

## 2019-12-08 DIAGNOSIS — F419 Anxiety disorder, unspecified: Secondary | ICD-10-CM | POA: Diagnosis not present

## 2019-12-18 DIAGNOSIS — R0782 Intercostal pain: Secondary | ICD-10-CM | POA: Diagnosis not present

## 2019-12-18 DIAGNOSIS — W19XXXA Unspecified fall, initial encounter: Secondary | ICD-10-CM | POA: Diagnosis not present

## 2020-01-08 DIAGNOSIS — N400 Enlarged prostate without lower urinary tract symptoms: Secondary | ICD-10-CM | POA: Diagnosis not present

## 2020-01-08 DIAGNOSIS — K219 Gastro-esophageal reflux disease without esophagitis: Secondary | ICD-10-CM | POA: Diagnosis not present

## 2020-01-08 DIAGNOSIS — F0391 Unspecified dementia with behavioral disturbance: Secondary | ICD-10-CM | POA: Diagnosis not present

## 2020-01-08 DIAGNOSIS — I10 Essential (primary) hypertension: Secondary | ICD-10-CM | POA: Diagnosis not present

## 2020-02-03 DIAGNOSIS — F0391 Unspecified dementia with behavioral disturbance: Secondary | ICD-10-CM | POA: Diagnosis not present

## 2020-02-03 DIAGNOSIS — Z515 Encounter for palliative care: Secondary | ICD-10-CM | POA: Diagnosis not present

## 2020-03-02 DEATH — deceased
